# Patient Record
Sex: Female | Born: 1985 | Race: White | Hispanic: No | Marital: Married | Smoking: Former smoker
Health system: Southern US, Community
[De-identification: ages and names within clinical notes are randomized; demographics above are authoritative.]

## PROBLEM LIST (undated history)

## (undated) ENCOUNTER — Inpatient Hospital Stay (HOSPITAL_COMMUNITY): Payer: Self-pay

## (undated) DIAGNOSIS — D069 Carcinoma in situ of cervix, unspecified: Secondary | ICD-10-CM

## (undated) DIAGNOSIS — Z973 Presence of spectacles and contact lenses: Secondary | ICD-10-CM

## (undated) DIAGNOSIS — F329 Major depressive disorder, single episode, unspecified: Secondary | ICD-10-CM

## (undated) DIAGNOSIS — F32A Depression, unspecified: Secondary | ICD-10-CM

## (undated) HISTORY — DX: Depression, unspecified: F32.A

## (undated) HISTORY — DX: Major depressive disorder, single episode, unspecified: F32.9

---
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
## 2004-07-20 ENCOUNTER — Other Ambulatory Visit: Admission: RE | Admit: 2004-07-20 | Discharge: 2004-07-20 | Payer: Self-pay | Admitting: Obstetrics and Gynecology

## 2005-04-04 ENCOUNTER — Emergency Department (HOSPITAL_COMMUNITY): Admission: EM | Admit: 2005-04-04 | Discharge: 2005-04-04 | Payer: Self-pay | Admitting: Emergency Medicine

## 2005-06-23 ENCOUNTER — Other Ambulatory Visit: Admission: RE | Admit: 2005-06-23 | Discharge: 2005-06-23 | Payer: Self-pay | Admitting: Obstetrics and Gynecology

## 2005-09-09 ENCOUNTER — Other Ambulatory Visit: Admission: RE | Admit: 2005-09-09 | Discharge: 2005-09-09 | Payer: Self-pay | Admitting: Obstetrics and Gynecology

## 2006-10-05 ENCOUNTER — Emergency Department (HOSPITAL_COMMUNITY): Admission: EM | Admit: 2006-10-05 | Discharge: 2006-10-05 | Payer: Self-pay | Admitting: Emergency Medicine

## 2006-11-11 ENCOUNTER — Emergency Department (HOSPITAL_COMMUNITY): Admission: EM | Admit: 2006-11-11 | Discharge: 2006-11-11 | Payer: Self-pay | Admitting: Emergency Medicine

## 2006-12-16 ENCOUNTER — Emergency Department (HOSPITAL_COMMUNITY): Admission: EM | Admit: 2006-12-16 | Discharge: 2006-12-16 | Payer: Self-pay | Admitting: Emergency Medicine

## 2007-01-02 ENCOUNTER — Inpatient Hospital Stay (HOSPITAL_COMMUNITY): Admission: AD | Admit: 2007-01-02 | Discharge: 2007-01-02 | Payer: Self-pay | Admitting: Obstetrics

## 2007-08-21 ENCOUNTER — Inpatient Hospital Stay (HOSPITAL_COMMUNITY): Admission: AD | Admit: 2007-08-21 | Discharge: 2007-08-24 | Payer: Self-pay | Admitting: Obstetrics and Gynecology

## 2009-02-13 ENCOUNTER — Inpatient Hospital Stay (HOSPITAL_COMMUNITY): Admission: RE | Admit: 2009-02-13 | Discharge: 2009-02-16 | Payer: Self-pay | Admitting: Obstetrics and Gynecology

## 2009-02-13 ENCOUNTER — Encounter (INDEPENDENT_AMBULATORY_CARE_PROVIDER_SITE_OTHER): Payer: Self-pay | Admitting: Obstetrics and Gynecology

## 2009-06-18 ENCOUNTER — Emergency Department (HOSPITAL_COMMUNITY): Admission: EM | Admit: 2009-06-18 | Discharge: 2009-06-18 | Payer: Self-pay | Admitting: Emergency Medicine

## 2009-12-06 ENCOUNTER — Ambulatory Visit (HOSPITAL_COMMUNITY): Admission: RE | Admit: 2009-12-06 | Discharge: 2009-12-06 | Payer: Self-pay | Admitting: Emergency Medicine

## 2010-07-25 ENCOUNTER — Emergency Department (HOSPITAL_COMMUNITY): Admission: EM | Admit: 2010-07-25 | Discharge: 2010-07-25 | Payer: Self-pay | Admitting: Emergency Medicine

## 2011-01-12 ENCOUNTER — Encounter (HOSPITAL_COMMUNITY): Payer: Self-pay | Admitting: Radiology

## 2011-01-12 ENCOUNTER — Inpatient Hospital Stay (HOSPITAL_COMMUNITY): Payer: Self-pay

## 2011-01-12 ENCOUNTER — Inpatient Hospital Stay (HOSPITAL_COMMUNITY)
Admission: AD | Admit: 2011-01-12 | Discharge: 2011-01-12 | Disposition: A | Discharge status: Home / Self Care | Payer: Self-pay | Source: Ambulatory Visit | Attending: Obstetrics and Gynecology | Admitting: Obstetrics and Gynecology

## 2011-01-12 DIAGNOSIS — M545 Low back pain, unspecified: Secondary | ICD-10-CM | POA: Insufficient documentation

## 2011-01-12 DIAGNOSIS — O99891 Other specified diseases and conditions complicating pregnancy: Secondary | ICD-10-CM | POA: Insufficient documentation

## 2011-01-12 DIAGNOSIS — O9989 Other specified diseases and conditions complicating pregnancy, childbirth and the puerperium: Secondary | ICD-10-CM

## 2011-01-12 LAB — RPR: RPR Ser Ql: NONREACTIVE

## 2011-01-12 LAB — HEPATITIS B SURFACE ANTIGEN: Hepatitis B Surface Ag: NEGATIVE

## 2011-01-12 LAB — CBC
HCT: 34.5 % — ABNORMAL LOW (ref 36.0–46.0)
Hemoglobin: 11.8 g/dL — ABNORMAL LOW (ref 12.0–15.0)
MCV: 91.3 fL (ref 78.0–100.0)
RBC: 3.78 MIL/uL — ABNORMAL LOW (ref 3.87–5.11)
RDW: 13.5 % (ref 11.5–15.5)
WBC: 12 10*3/uL — ABNORMAL HIGH (ref 4.0–10.5)

## 2011-01-12 LAB — URINALYSIS, ROUTINE W REFLEX MICROSCOPIC
Bilirubin Urine: NEGATIVE
Ketones, ur: NEGATIVE mg/dL
Nitrite: NEGATIVE
Urobilinogen, UA: 0.2 mg/dL (ref 0.0–1.0)
pH: 5.5 (ref 5.0–8.0)

## 2011-01-12 LAB — DIFFERENTIAL
Basophils Absolute: 0 10*3/uL (ref 0.0–0.1)
Lymphocytes Relative: 21 % (ref 12–46)
Lymphs Abs: 2.5 10*3/uL (ref 0.7–4.0)
Neutro Abs: 8.5 10*3/uL — ABNORMAL HIGH (ref 1.7–7.7)
Neutrophils Relative %: 71 % (ref 43–77)

## 2011-01-12 LAB — WET PREP, GENITAL: Clue Cells Wet Prep HPF POC: NONE SEEN

## 2011-01-12 LAB — TYPE AND SCREEN: Antibody Screen: NEGATIVE

## 2011-01-12 LAB — RUBELLA SCREEN: Rubella: 150.4 IU/mL — ABNORMAL HIGH

## 2011-01-13 LAB — GC/CHLAMYDIA PROBE AMP, GENITAL
Chlamydia, DNA Probe: NEGATIVE
GC Probe Amp, Genital: NEGATIVE

## 2011-02-10 ENCOUNTER — Inpatient Hospital Stay (HOSPITAL_COMMUNITY): Payer: Self-pay

## 2011-02-10 ENCOUNTER — Inpatient Hospital Stay (HOSPITAL_COMMUNITY)
Admission: AD | Admit: 2011-02-10 | Discharge: 2011-02-10 | Disposition: A | Discharge status: Home / Self Care | Payer: Self-pay | Source: Ambulatory Visit | Attending: Obstetrics & Gynecology | Admitting: Obstetrics & Gynecology

## 2011-02-10 DIAGNOSIS — O99891 Other specified diseases and conditions complicating pregnancy: Secondary | ICD-10-CM | POA: Insufficient documentation

## 2011-02-10 DIAGNOSIS — R109 Unspecified abdominal pain: Secondary | ICD-10-CM

## 2011-02-10 DIAGNOSIS — W010XXA Fall on same level from slipping, tripping and stumbling without subsequent striking against object, initial encounter: Secondary | ICD-10-CM | POA: Insufficient documentation

## 2011-02-10 DIAGNOSIS — O9989 Other specified diseases and conditions complicating pregnancy, childbirth and the puerperium: Secondary | ICD-10-CM

## 2011-02-10 LAB — COMPREHENSIVE METABOLIC PANEL
AST: 22 U/L (ref 0–37)
CO2: 21 mEq/L (ref 19–32)
Calcium: 8.1 mg/dL — ABNORMAL LOW (ref 8.4–10.5)
Creatinine, Ser: 0.47 mg/dL (ref 0.4–1.2)
GFR calc Af Amer: >60 mL/min (ref >60)
GFR calc non Af Amer: >60 mL/min (ref >60)
Glucose, Bld: 95 mg/dL (ref 70–99)
Total Protein: 5.8 g/dL — ABNORMAL LOW (ref 6.0–8.3)

## 2011-02-10 LAB — CBC
Hemoglobin: 11.5 g/dL — ABNORMAL LOW (ref 12.0–15.0)
MCH: 30.6 pg (ref 26.0–34.0)
MCHC: 33.2 g/dL (ref 30.0–36.0)
RDW: 13.9 % (ref 11.5–15.5)

## 2011-02-10 LAB — ABO/RH: ABO/RH(D): O POS

## 2011-02-10 LAB — KLEIHAUER-BETKE STAIN: Quantitation Fetal Hemoglobin: 0 mL

## 2011-03-13 LAB — PREGNANCY, URINE: Preg Test, Ur: NEGATIVE

## 2011-03-13 LAB — DIFFERENTIAL
Basophils Absolute: 0 10*3/uL (ref 0.0–0.1)
Basophils Relative: 0 % (ref 0–1)
Neutro Abs: 10.5 10*3/uL — ABNORMAL HIGH (ref 1.7–7.7)
Neutrophils Relative %: 83 % — ABNORMAL HIGH (ref 43–77)

## 2011-03-13 LAB — URINALYSIS, ROUTINE W REFLEX MICROSCOPIC
Ketones, ur: >80 mg/dL — AB
Protein, ur: NEGATIVE mg/dL
Urobilinogen, UA: 0.2 mg/dL (ref 0.0–1.0)

## 2011-03-13 LAB — BASIC METABOLIC PANEL
Calcium: 9.6 mg/dL (ref 8.4–10.5)
Creatinine, Ser: 0.67 mg/dL (ref 0.4–1.2)
GFR calc Af Amer: >60 mL/min (ref >60)
Sodium: 139 mEq/L (ref 135–145)

## 2011-03-13 LAB — URINE MICROSCOPIC-ADD ON

## 2011-03-13 LAB — CBC
RBC: 4.57 MIL/uL (ref 3.87–5.11)
WBC: 12.5 10*3/uL — ABNORMAL HIGH (ref 4.0–10.5)

## 2011-03-13 LAB — RAPID URINE DRUG SCREEN, HOSP PERFORMED
Benzodiazepines: POSITIVE — AB
Cocaine: NOT DETECTED
Opiates: NOT DETECTED
Tetrahydrocannabinol: POSITIVE — AB

## 2011-03-17 LAB — CBC
Hemoglobin: 9 g/dL — ABNORMAL LOW (ref 12.0–15.0)
MCHC: 32.9 g/dL (ref 30.0–36.0)
MCV: 88.4 fL (ref 78.0–100.0)
Platelets: 230 10*3/uL (ref 150–400)
RBC: 3.07 MIL/uL — ABNORMAL LOW (ref 3.87–5.11)
RDW: 13.7 % (ref 11.5–15.5)
RDW: 14.1 % (ref 11.5–15.5)
WBC: 12.1 10*3/uL — ABNORMAL HIGH (ref 4.0–10.5)

## 2011-03-17 LAB — CCBB MATERNAL DONOR DRAW

## 2011-03-17 LAB — RPR: RPR Ser Ql: NONREACTIVE

## 2011-04-13 ENCOUNTER — Other Ambulatory Visit: Payer: Self-pay | Admitting: Obstetrics and Gynecology

## 2011-04-13 ENCOUNTER — Other Ambulatory Visit: Payer: Self-pay | Admitting: Obstetrics & Gynecology

## 2011-04-13 DIAGNOSIS — O0930 Supervision of pregnancy with insufficient antenatal care, unspecified trimester: Secondary | ICD-10-CM

## 2011-04-13 DIAGNOSIS — Z331 Pregnant state, incidental: Secondary | ICD-10-CM

## 2011-04-13 LAB — POCT URINALYSIS DIP (DEVICE)
Glucose, UA: NEGATIVE mg/dL
Nitrite: NEGATIVE
Urobilinogen, UA: 0.2 mg/dL (ref 0.0–1.0)

## 2011-04-18 ENCOUNTER — Ambulatory Visit (HOSPITAL_COMMUNITY)
Admission: RE | Admit: 2011-04-18 | Discharge: 2011-04-18 | Disposition: A | Discharge status: Home / Self Care | Payer: Medicaid Other | Source: Ambulatory Visit | Attending: Obstetrics and Gynecology | Admitting: Obstetrics and Gynecology

## 2011-04-18 DIAGNOSIS — O0930 Supervision of pregnancy with insufficient antenatal care, unspecified trimester: Secondary | ICD-10-CM | POA: Insufficient documentation

## 2011-04-18 DIAGNOSIS — O365990 Maternal care for other known or suspected poor fetal growth, unspecified trimester, not applicable or unspecified: Secondary | ICD-10-CM | POA: Insufficient documentation

## 2011-04-19 NOTE — Op Note (Signed)
Jasmine Hernandez, Jasmine Hernandez NO.:  [id number]   MEDICAL RECORD NO.:  [id number]          PATIENT TYPE:  INP   LOCATION:  9146                          [facility]   PHYSICIAN:  Osborn Coho, M.D.   DATE OF BIRTH:  Apr 14, 1986   DATE OF PROCEDURE:  02/13/2009  DATE OF DISCHARGE:                               OPERATIVE REPORT   PREOPERATIVE DIAGNOSES:  1. Postdates induction.  2. Failure to progress.  3. Decelerations.   POSTOPERATIVE DIAGNOSES:  1. Postdates induction.  2. Failure to progress.  3. Decelerations.   PROCEDURE:  Primary low transverse C-section.   ANESTHESIA:  Epidural.   ATTENDING DOCTOR:  Osborn Coho, MD   ASSISTANT:  Renaldo Reel. Emilee Hero, CNM   FLUIDS:  2000 mL.   ESTIMATED BLOOD LOSS:  900 mL.   URINE OUTPUT:  150 mL.   COMPLICATIONS:  None.   FINDINGS:  Live female infant with Apgars of 9 at 1 minute and 9 at 5  minutes weighing 8 pounds and 8 ounces.  Normal-appearing bilateral  ovaries and fallopian tubes.   PROCEDURE IN DETAIL:  The patient was taken to the operating room after  the risks, benefits, and alternatives have been discussed with the  patient.  The patient verbalized understanding and consent signed and  witnessed.  The patient was given a surgical level via the epidural and  prepped and draped in normal sterile fashion in the supine position.  A  Pfannenstiel skin incision was made and carried down to the underlying  layer of fascia with the scalpel and Bovie.  The fascia was excised  bilaterally in the midline and extended bilaterally with a Mayo  scissors.  Kocher clamps were placed on the inferior aspect of the  fascial incision and the rectus muscle excised from the fascia.  The  same was done on the superior aspect of the fascial incision.  The  rectus muscle was separated in the midline and the peritoneum entered  bluntly and extended manually.  The bladder blade was placed and bladder  flap was created with a  Metzenbaum scissors.  The uterine incision was  made with a scalpel and extended bilaterally with the bandage scissors.  The infant was delivered in the right occiput transverse vertex  presentation and the infant was delivered without difficulty.  The cord  was clamped and cut and the infant handed to the awaiting pediatricians.  The placenta was removed via fundal massage and the uterus cleared of  all clots and debris.  There was a small approximately 1 cm extension on  the right aspect of the uterine incision, which was repaired with 0  Vicryl via a running interlocking stitch.  The primary uterine incision  was repaired with 0 Vicryl via a running interlocking stitch.  The  extension and the primary incision were repaired with a second layer via  a imbricating stitch.  The intra-abdominal cavity was copiously  irrigated and normal-appearing bilateral ovaries and fallopian tubes  were noted.  The peritoneum was repaired with 2-0 chromic in a running  fashion.  The fascia was repaired with 0 Vicryl in a running fashion and  as of note, the fascia was noted to be extremely thin, especially on the  left aspect of the fascial incision.  The subcutaneous tissue was  irrigated and made hemostatic with the Bovie and reapproximated using  several interrupted stitches of 2-0 plain.  The skin was reapproximated  using 3-0 Monocryl via subcuticular stitch.  Sponge, lap, and needle  count was correct.  The patient tolerated the procedure well and is  currently awaiting transfer to the recovery room in good condition.       Osborn Coho, M.D.  Electronically Signed     AR/MEDQ  D:  02/13/2009  T:  02/14/2009  Job:  [id number]

## 2011-04-19 NOTE — H&P (Signed)
NAMEJASHA, HODZIC NO.:  [id number]   MEDICAL RECORD NO.:  [id number]          PATIENT TYPE:  INP   LOCATION:  9170                          [facility]   PHYSICIAN:  Osborn Coho, M.D.   DATE OF BIRTH:  Nov 02, 1986   DATE OF ADMISSION:  08/21/2007  DATE OF DISCHARGE:                              HISTORY & PHYSICAL   This 25 year old gravida 1, para 0 at 15 and 2/7 weeks who presents for  induction of labor secondary to post dates.  She denies leaking or  bleeding and reports positive fetal movement.  Pregnancy has been  followed by the nurse midwife service and remarkable for:  1. Irregular menses.  2. Anxiety.  3. Irritable bowel syndrome.  4. Family history of cystic fibrosis.   ALLERGIES:  None.   OBSTETRICAL HISTORY:  Remarkable for being a primigravida.   MEDICAL HISTORY:  Remarkable for history of STZH 15, childhood  varicella, history of irritable bowel syndrome and history of depression  and anxiety.   SURGICAL HISTORY:  Negative.   FAMILY HISTORY:  Remarkable for grandfather with heart disease,  grandmother with hypertension, grandfather with emphysema and stroke and  lung cancer.  Father with drug use, alcohol use.  Genetic history is  remarkable for a sister with cystic fibrosis, a third cousin with spina  bifida, a nephew of the baby's father with sickle cell, and a sister  with twins.   SOCIAL HISTORY:  The patient is a married to Michaela Corner who is  involved and supportive.  She works as an Public house manager.  She does not report a  religious affiliation.  She denies any alcohol, tobacco or drug use.   PERTINENT LABS:  Hemoglobin 12.8, platelets 235, blood type O+, antibody  screen negative, RPR nonreactive, rubella immune, hepatitis negative,  HIV negative, gonorrhea negative, chlamydia negative, cystic fibrosis  negative.   HISTORY OF CURRENT PREGNANCY:  Patient entered care at [redacted] weeks  gestation.  She declined first trimester  screening and quad screening.  She had an ultrasound at 18 weeks that was normal.  She stopped smoking  in the second trimester.  She had an elevated glucose at 26 weeks and a  3 hour GTT that was normal.  She had insomnia in the third trimester  treated with Ambien and positive group B Strep at term and presents  today for induction of labor secondary to post dates.   OBJECTIVE DATA:  VITAL SIGNS:  Stable.  Afebrile.  HEENT:  Within normal limits.  Thyroid normal, not enlarged.  CHEST:  Clear to auscultation.  HEART:  Regular rate and rhythm.  ABDOMEN:  Gravid at 39 cm vertex __________ .  EFM shows reactive fetal  heart rate with irregular contractions.  Cervix is closed, 60%, -3 and  soft with a vertex presentation.  EXTREMITIES:  Within normal limits.   ASSESSMENT:  1. Intrauterine pregnancy at 41-2/7 weeks.  2. Post dates.   PLAN:  1. Admit to birthing suite per Dr. Su Hilt.  2. Routine CNM orders.  3. Potassium per low dose protocol.  4. Penicillin prophylaxis.      Marie L. Williams, C.N.M.      Osborn Coho, M.D.  Electronically Signed    MLW/MEDQ  D:  08/21/2007  T:  08/21/2007  Job:  [id number]

## 2011-04-19 NOTE — Consult Note (Signed)
NAMEKHANIYA, TENAGLIA                  ACCOUNT NO.:  [id number]   MEDICAL RECORD NO.:  [id number]         PATIENT TYPE:  INP   LOCATION:                                [facility]   PHYSICIAN:  Hal Morales, M.D.DATE OF BIRTH:  1986/06/20   DATE OF CONSULTATION:  DATE OF DISCHARGE:  02/16/2009                                 CONSULTATION   HISTORY OF PRESENT ILLNESS:  Jasmine Hernandez is a 25 year old gravida 2, para 1-  0-0-1, at 41-2/7 weeks who presents for induction of labor secondary to  post dates.  Cervix has been 3 cm in the office.  Pregnancy has been  remarkable for:  1.  History of moderate shoulder dystocia with her last  pregnancy with a weight 8 pounds 14 ounces; 2.  Questionable LMP, no  regular cycles; 3.  Conception on OCTs; 4.  History of anxiety and  depression; 5.  History of IBS; 6.  Group B Strep negative.   LABORATORY DATA:  Prenatal labs:  Blood type is O positive, Rh antibody  negative, VDRL nonreactive, rubella titer positive.  Hepatitis B surface  antigen negative.  HIV was nonreactive.  Cystic fibrosis testing was  negative.  Two sickle A cultures were negative at her first visit.  Pap  was normal.  Hemoglobin upon entering practice was 11.8.  It was within  normal limits at 28 weeks.  The patient declined first trimester screen  and AFP.  Glucola was normal.  Group B Strep culture and other cultures  were negative at 36 weeks.   HISTORY OF PRESENT PREGNANCY:  The patient entered care at approximately  10-12 weeks.  She had some question of LMP secondary to conception on  birth control pills.  She had an ultrasound for dating at 1 weeks which  gave an [facility] ([facility] of February 04, 2009.  She declined first trimester screen and  AFP.  She was having some low blood pressure at 16 and 18 weeks with  comfort measures reviewed.  She received H1 N1 vaccine at 23 weeks.  She  had a normal Glucola.  She did have some severe insomnia at 28 weeks was  given Ambien.  She saw a  dentist at 34 weeks for tooth pain and was  treated with an antibiotic and Vicodin.  Her group B Strep culture and  GC and Chlamydia cultures were negative at 36 weeks.   OBSTETRICAL HISTORY:  In 2008 she had a vaginal birth of a female infant,  weight 8 pounds 14 ounces.  She delivered at 41-2/7 weeks.  She was in  labor 26 hours.  She had epidural anesthesia.  She was induced for that  labor as well, had meconium, had a midline episiotomy and moderate  shoulder dystocia.  She did have positive group B Strep with that  pregnancy as well.   MEDICAL HISTORY:  She was on birth control pills, patch in the past.  She did conceive on birth control pills.  She was treated for Chlamydia,  gonorrhea, HPV lesion, and yeast at age 72.  She reports  the usual  childhood illnesses.  She has a history of IBF but has had no medication  needs.  She has a history of UTI.  She has some seasonal depression.  She has been treated in the past for anxiety but has not required any  medication during this pregnancy.   ALLERGIES:  She has no known medication allergies.   FAMILY HISTORY:  Maternal grandfather had a pacemaker.  Paternal  grandmother has hypertension.  Maternal grandfather had emphysema.  Maternal grandfather had a stroke.  Maternal grandfather had lung cancer  and brain cancer.  Father's side of the family are alcoholic users, drug  users, and smokers.  The patient's only other hospitalization was for  childbirth.   PAST SURGICAL HISTORY:  She denies any surgical history.   GENETIC HISTORY:  The patient's half sister has cystic fibrosis.  The  patient is a negative cystic fibrosis carrier.  The patient's third  cousin has spina bifida.  The father of the baby's nephew has sickle  cell trait and the patient has sisters who are twins.   SOCIAL HISTORY:  The patient is Native Naval architect.  She denies a religious  affiliation.  She is single.  Her partner's name is Arnette Norris.  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  Her partner is not  employed.  She has been followed by the certified nurse midwife service  of [place].  She denies any alcohol, drug, or tobacco use  during this pregnancy.   PHYSICAL EXAMINATION:  VITAL SIGNS:  Stable.  The patient is afebrile.  HEENT:  Within normal limits.  LUNGS:  Her breath sounds are clear.  HEART:  Regular rate and rhythm without murmur.  BREASTS:  Soft and nontender.  ABDOMEN:  Fundal height is approximately 40 cm.  Estimated fetal weight  on ultrasound today was 8-1/2 to 8 pounds 13 ounces.  Fetal heart rate  was in the 140s in the office today.  CERVIX:  Cervical exam today was 3 cm, vertex presentation by office  exam.  Uterine contractions were reported by the patient as occasional.  EXTREMITIES:  Deep tendon reflexes were 2+ without clonus.  There is  trace edema noted.   IMPRESSION:  1. Intrauterine pregnancy at 41-2/7 weeks.  2. Post dates.  3. History of shoulder dystocia.   PLAN:  1. Admit to birthing suite for consult with Dr. Pennie Rushing as attending      physician.  2. Routine certified nurse midwife orders.  3. Anticipate starting Pitocin to facilitate uterine contractions and      artifical rupture of membranes as an adjunct.  4. The patient's previous history of shoulder dystocia was discussed      with her including the plan for avoidance of an operative delivery,      need to monitor descent in the second stage, and the risks of      shoulder dystocia recurring.  The patient declined a cesarean      section for her history of previous shoulder dystocia and does wish      to proceed with induction.      Renaldo Reel Emilee Hero, C.N.M.      Hal Morales, M.D.  Electronically Signed    VLL/MEDQ  D:  02/12/2009  T:  02/13/2009  Job:  [id number]

## 2011-04-22 NOTE — Discharge Summary (Signed)
NAMEKIMBERLEA, SCHLAG                  ACCOUNT NO.:  [id number]   MEDICAL RECORD NO.:  [id number]          PATIENT TYPE:  INP   LOCATION:  9146                          [facility]   PHYSICIAN:  Janine Limbo, M.D.DATE OF BIRTH:  22-Jun-1986   DATE OF ADMISSION:  02/13/2009  DATE OF DISCHARGE:  02/16/2009                               DISCHARGE SUMMARY   ADMITTING DIAGNOSES:  1. Intrauterine pregnancy at 41-2/7th weeks.  2. Post dates.  3. History of shoulder dystocia.   DISCHARGE DIAGNOSES:  1. Intrauterine pregnancy at 41-2/7th weeks.  2. Failure to progress.  3. Nonreassuring fetal heart rate.   PROCEDURES:  Primary low transverse cesarean section.   HOSPITAL COURSE:  Jasmine Hernandez is a 25 year old gravida 2, para 1-0-0-1 of  41-2/7th weeks who presented for induction on the morning of February 13, 2009, due to post dates.  Cervix had been 3 cm in the office.  Pregnancy  has been remarkable for;  1. History of moderate shoulder dystocia with a weight of 8 pounds 14      ounces in her previous delivery.  2. Questionable last menstrual period with irregular cycles.  3. Conception on oral contraceptives.  4. History of anxiety and depression.  5. History of irritable bowel syndrome.  6. Group B strep negative.   On admission, cervix was posterior 3 cm, 60% vertex, -2 to -3.  The  risks and benefits of induction were reviewed with the patient including  failure of method, prolonged labor, shoulder dystocia, and need for  cesarean section.  The patient and her husband seem to understand these  risks and wished to proceed.  Pitocin was begun in light of the station  of the fetal vertex.  Artificial rupture of membranes was accomplished  at approximately 12:25 p.m. with cervix 4-5 at that time, 80% vertex at  -2.  She began to have some variable decelerations with some scattered  lates after rupture of membranes.  IUPC was inserted.  Pitocin had been  stopped in the course of the  decelerations and with recovery of fetal  heart rate, it was restarted.  She had a prolonged bradycardia episode  at 4:40 p.m., had gradually resolved.  She was consented for C-section  at that time; however, the patient wished to wait period of time for  making the final decision.  She was reevaluated again soon after that.  Amount of these were adequate for approximately 2 hours.  She was  offered a C-section at that time in light of the cervix not changing.  The patient again declined.  An amnioinfusion was begun.  By  approximately 7:30 that evening based on early and variable  decelerations resumed and then variables became deeper over time after  the Pitocin have been restarted to keep her cervix adequate.  Her cervix  did not change and began to be swollen.  She was advised again that  recommendation was for C-section and the patient at that time was  agreeable.  She was taken to the operating room where a primary low  transverse  cesarean section was performed by Dr. Su Hilt under existing  epidural anesthesia.  Findings were of viable female weight 8 pounds 8  ounces.  Apgars were 9 and 9.  Infant was taken to the full-term  nursery.  Mother was taken to recovery in good condition.   By postop day #1, the patient is doing well.  She was up ad lib.  She  was working on breast-feeding.  Her hemoglobin was 9.0 down from 10.5,  white blood cell count was 18.9, and platelet count was 199.  She was  begun on iron supplementation.  The rest of her hospital stay was  essentially uncomplicated.  Her incision remained clean, dry, and  intact.  She was up ad lib without any dizziness or syncope.  She began  to tolerate a regular diet and was having no issues.  By postop day #3,  she was desiring of Depo prior to discharge and then a plan for an IUD  later after her initial 12-week recovery.  She was still having good  pain management with Motrin and Percocet and her incision was clean,  dry,  and intact and she was up ad lib.  Her lochia was scant.  Her  fundus was firm.  She was doing breast and bottle feeding by that time.  She was deemed to receive full benefit of her hospital stay and was  discharged home in stable condition.   DISCHARGE INSTRUCTIONS:  Per [facility] handout.   DISCHARGE MEDICATIONS:  1. Motrin 600 mg p.o. q.6 h p.r.n. pain.  2. Percocet 5/325 one to two p.o. daily 4 hours p.r.n. pain.  3. Concept [facility] prenatal vitamin one p.o. daily.  4. She was also recommended take Colace one p.o. daily over-the-      counter.  5. She was given Depo-Provera 150 mg IM x1 just prior to discharge.   Discharge followup will occur in 6 weeks at [facility] or  p.r.n.      Renaldo Reel Emilee Hero, C.N.M.      Janine Limbo, M.D.  Electronically Signed    VLL/MEDQ  D:  03/19/2009  T:  03/19/2009  Job:  [id number]

## 2011-04-26 ENCOUNTER — Inpatient Hospital Stay (HOSPITAL_COMMUNITY)
Admission: AD | Admit: 2011-04-26 | Discharge: 2011-04-26 | Disposition: A | Discharge status: Home / Self Care | Payer: Medicaid Other | Source: Ambulatory Visit | Attending: Obstetrics & Gynecology | Admitting: Obstetrics & Gynecology

## 2011-04-26 DIAGNOSIS — R109 Unspecified abdominal pain: Secondary | ICD-10-CM

## 2011-04-26 DIAGNOSIS — M62838 Other muscle spasm: Secondary | ICD-10-CM

## 2011-04-26 DIAGNOSIS — O99891 Other specified diseases and conditions complicating pregnancy: Secondary | ICD-10-CM | POA: Insufficient documentation

## 2011-04-26 DIAGNOSIS — O9989 Other specified diseases and conditions complicating pregnancy, childbirth and the puerperium: Secondary | ICD-10-CM

## 2011-04-26 LAB — URINALYSIS, ROUTINE W REFLEX MICROSCOPIC
Glucose, UA: NEGATIVE mg/dL
Ketones, ur: NEGATIVE mg/dL
Nitrite: NEGATIVE
Protein, ur: NEGATIVE mg/dL

## 2011-04-26 LAB — WET PREP, GENITAL: Trich, Wet Prep: NONE SEEN

## 2011-04-26 LAB — URINE MICROSCOPIC-ADD ON

## 2011-04-27 LAB — GC/CHLAMYDIA PROBE AMP, GENITAL
Chlamydia, DNA Probe: NEGATIVE
GC Probe Amp, Genital: NEGATIVE

## 2011-05-04 ENCOUNTER — Other Ambulatory Visit: Payer: Self-pay | Admitting: Physician Assistant

## 2011-05-04 DIAGNOSIS — O2390 Unspecified genitourinary tract infection in pregnancy, unspecified trimester: Secondary | ICD-10-CM

## 2011-05-04 DIAGNOSIS — O34219 Maternal care for unspecified type scar from previous cesarean delivery: Secondary | ICD-10-CM

## 2011-05-04 DIAGNOSIS — O0930 Supervision of pregnancy with insufficient antenatal care, unspecified trimester: Secondary | ICD-10-CM

## 2011-05-05 LAB — POCT URINALYSIS DIP (DEVICE)
Glucose, UA: NEGATIVE mg/dL
Ketones, ur: >=160 mg/dL — AB
Specific Gravity, Urine: >=1.03 (ref 1.005–1.030)

## 2011-05-11 ENCOUNTER — Inpatient Hospital Stay (HOSPITAL_COMMUNITY)
Admission: AD | Admit: 2011-05-11 | Discharge: 2011-05-13 | DRG: 775 | Disposition: A | Discharge status: Home / Self Care | Payer: Medicaid Other | Source: Ambulatory Visit | Attending: Obstetrics & Gynecology | Admitting: Obstetrics & Gynecology

## 2011-05-11 DIAGNOSIS — O99892 Other specified diseases and conditions complicating childbirth: Secondary | ICD-10-CM | POA: Diagnosis present

## 2011-05-11 DIAGNOSIS — O9989 Other specified diseases and conditions complicating pregnancy, childbirth and the puerperium: Secondary | ICD-10-CM

## 2011-05-11 DIAGNOSIS — Z22330 Carrier of Group B streptococcus: Secondary | ICD-10-CM

## 2011-05-11 LAB — CBC
HCT: 34.4 % — ABNORMAL LOW (ref 36.0–46.0)
Hemoglobin: 11.6 g/dL — ABNORMAL LOW (ref 12.0–15.0)
MCV: 92.2 fL (ref 78.0–100.0)
RBC: 3.73 MIL/uL — ABNORMAL LOW (ref 3.87–5.11)
RDW: 14.5 % (ref 11.5–15.5)
WBC: 17.6 10*3/uL — ABNORMAL HIGH (ref 4.0–10.5)

## 2011-05-11 LAB — RPR: RPR Ser Ql: NONREACTIVE

## 2011-05-11 LAB — RAPID URINE DRUG SCREEN, HOSP PERFORMED
Benzodiazepines: NOT DETECTED
Cocaine: NOT DETECTED
Opiates: POSITIVE — AB
Tetrahydrocannabinol: POSITIVE — AB

## 2011-05-16 ENCOUNTER — Inpatient Hospital Stay (HOSPITAL_COMMUNITY)
Admission: AD | Admit: 2011-05-16 | Discharge: 2011-05-16 | Disposition: A | Discharge status: Home / Self Care | Payer: Medicaid Other | Source: Ambulatory Visit | Attending: Obstetrics & Gynecology | Admitting: Obstetrics & Gynecology

## 2011-05-16 DIAGNOSIS — O901 Disruption of perineal obstetric wound: Secondary | ICD-10-CM | POA: Insufficient documentation

## 2011-05-23 ENCOUNTER — Ambulatory Visit (INDEPENDENT_AMBULATORY_CARE_PROVIDER_SITE_OTHER): Payer: Medicaid Other | Admitting: Family Medicine

## 2011-05-23 DIAGNOSIS — F329 Major depressive disorder, single episode, unspecified: Secondary | ICD-10-CM

## 2011-05-23 DIAGNOSIS — N719 Inflammatory disease of uterus, unspecified: Secondary | ICD-10-CM

## 2011-05-24 NOTE — Group Therapy Note (Unsigned)
NAMEJAMAIRA, Jasmine Hernandez NO.:  [id number]  MEDICAL RECORD NO.:  [id number]           PATIENT TYPE:  A  [facility]ATION:  [facility]                   [facility]  PHYSICIAN:  Lucina Mellow, DO   DATE OF BIRTH:  09-27-1986  DATE OF SERVICE:  05/23/2011                                 CLINIC NOTE  HISTORY OF PRESENT ILLNESS:  Ms. Jasmine Hernandez is a 25 year old gravida 3, para 3- 2-1-0-3 who presents to clinic today for pain at stitch site.  The patient was in MAU on Monday for similar pain where they told her to sitz bath and continue Percocet and ibuprofen.  The patient states she has tried this modalities.  She is still a 7/10 pain, which is relieved to 4/10 with Percocet and ibuprofen.  The patient is also using lidocaine jelly for the pain, which also helps.  The patient also complaints of cramping and bleeding worse than after delivery.  She denies fevers or chills.  She denies dysuria.  She denies constipation. The patient does state that it is more painful to defecate.  The patient also states that she has been feeling depressed because her husband recently left her and would like contact numbers for psychologist.  She states she does not want to be medication at this time.  She denies any suicidal ideation or homicidal ideation.  PAST MEDICAL HISTORY:  Depression.  ALLERGIES:  No known drug allergies.  HOME MEDICATIONS: 1. Percocet p.o. p.r.n. pain. 2. Ibuprofen 800 mg p.o. p.r.n. pain. 3. Lidocaine Jelly 2% topical p.r.n. pain. 4. Colace oral p.o. p.r.n. constipation.  REVIEW OF SYSTEMS:  GENERAL:  The patient is in no acute distress who appears her stated age of 18.  Denies fevers.  GI:  Denies constipation, nausea, or vomiting.  Does take pain with defecation.  GU:  The patient denies dysuria.  PHYSICAL EXAMINATION:  ABDOMEN:  Soft, mildly tender with palpation. VAGINAL:  Laceration appears to be healing well.  Sutures are visible, not reabsorbed. SKIN:   Two hemorrhoids externally visible on anal surface, one at 11 O'clock and one at 3 O'clock.  ASSESSMENT/PLAN:  This is a 25 year old gravida 3, para 3-2-1-0-3, 3 weeks postpartum who complaints of pain related with her sutures. 1. Failure of suture absorption - suture was visible on exam.  Suture     was removed.  Refill was given for lidocaine jelly who present to     be apply topically to affected area q.4 h. p.r.n. pain.  Refill was     also given for Percocet one tablet p.o. q.6 h. p.r.n. severe pain. 2. Endometritis - the patient's states abdominal cramping and     bleeding.  The patient has been afebrile, but this could be due to     her continuous use of ibuprofen.  The patient was given a     prescription for Augmentin 875 mg one tablet p.o. b.i.d. for 10     days. 3. Postpartum depression - this could be a situational depression due     to her recent change a relationship with her husband.  The patient  requests phone number for Psychology.  She refuses SSRIs at the     current time. 4. 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 5. The patient advised to follow up in clinic next week as for her 4-     week followup and Mirena placement.          ______________________________ Lucina Mellow, DO    SH/MEDQ  D:  05/23/2011  T:  05/24/2011  Job:  [id number]

## 2011-06-02 ENCOUNTER — Ambulatory Visit (INDEPENDENT_AMBULATORY_CARE_PROVIDER_SITE_OTHER): Payer: Medicaid Other | Admitting: Physician Assistant

## 2011-06-02 DIAGNOSIS — F329 Major depressive disorder, single episode, unspecified: Secondary | ICD-10-CM

## 2011-06-03 NOTE — Group Therapy Note (Signed)
NAME:  Jasmine Hernandez, Jasmine Hernandez NO.:  [id number]  MEDICAL RECORD NO.:  [id number]           PATIENT TYPE:  A  [facility]                   [facility]  PHYSICIAN:  Maylon Cos, CNM    DATE OF BIRTH:  07-15-1986  DATE OF SERVICE:  06/02/2011                                 CLINIC NOTE  The patient presents today to discuss depression medication.  She is approximately 3 weeks postpartum.  She states that she has had some difficulty with depression in the past and has been on Celexa.  This seemed to work for her.  She states her recent home situation has changed and she has been having feelings of depression including crying episodes.  She denies homicidal or suicidal ideations.  Actually, she just think she needs something to help.  She denies any other problems at this time.  Other than a toothache for which she is trying to see a dentist.  PHYSICAL EXAMINATION:  GENERAL:  She is a pleasant, alert and oriented x3, in no apparent distress. VITAL SIGNS:  Stable.  ASSESSMENT AND PLAN: 1. Depression.  We discussed this at length.  We will start her on     Celexa 20 mg one p.o. daily.  She has a followup appointment     already scheduled for which she will keep for her 4-week postpartum     visit and IUD insertion.  I did discuss diet, activities, risks,     and precautions, she understood this and agreed. 2. Toothache.  She is getting scheduled to see a dentist in the     meantime and did give her Lortab 5/500 to take one p.o. q.4-6 h.     p.r.n. pain #20, with no refills.  She is to use this sparingly.     She had no other questions or problems at this time.    ______________________________ Clinton Gallant. Rice III   ______________________________ Maylon Cos, CNM   /MEDQ  D:  06/02/2011  T:  06/03/2011  Job:  [id number]

## 2011-06-04 DIAGNOSIS — F329 Major depressive disorder, single episode, unspecified: Secondary | ICD-10-CM

## 2011-06-04 DIAGNOSIS — F32A Depression, unspecified: Secondary | ICD-10-CM | POA: Insufficient documentation

## 2011-06-15 ENCOUNTER — Encounter: Payer: Self-pay | Admitting: Family Medicine

## 2011-06-15 ENCOUNTER — Ambulatory Visit (INDEPENDENT_AMBULATORY_CARE_PROVIDER_SITE_OTHER): Payer: Medicaid Other | Admitting: Family Medicine

## 2011-06-15 ENCOUNTER — Other Ambulatory Visit: Payer: Self-pay | Admitting: Family Medicine

## 2011-06-15 VITALS — BP 132/68 | HR 102 | Temp 98.7°F | Ht 61.25 in | Wt 122.1 lb

## 2011-06-15 DIAGNOSIS — Z30430 Encounter for insertion of intrauterine contraceptive device: Secondary | ICD-10-CM

## 2011-06-15 DIAGNOSIS — Z3009 Encounter for other general counseling and advice on contraception: Secondary | ICD-10-CM

## 2011-06-15 DIAGNOSIS — K029 Dental caries, unspecified: Secondary | ICD-10-CM

## 2011-06-15 MED ORDER — LEVONORGESTREL 20 MCG/24HR IU IUD: 1.0000 | INTRAUTERINE_SYSTEM | Freq: Once | INTRAUTERINE | Status: DC

## 2011-06-15 MED ORDER — OXYCODONE-ACETAMINOPHEN 5-325 MG PO TABS: 1.0000 | ORAL_TABLET | Freq: Four times a day (QID) | ORAL | Status: DC | PRN

## 2011-06-15 NOTE — Progress Notes (Signed)
  IUD Insertion Procedure Note  Pre-operative Diagnosis: Undesired fertility  Post-operative Diagnosis: same  Indications: contraception  Procedure Details  Urine pregnancy test was done today and result was negative.  The risks (including infection, bleeding, pain, and uterine perforation) and benefits of the procedure were explained to the patient and Written informed consent was obtained.    Cervix cleansed with Betadine. Uterus sounded to 7 cm. IUD inserted without difficulty. String visible and trimmed. Patient tolerated procedure well.  IUD Information: Mirena.  Condition: Stable  Complications: None  Plan:  The patient was advised to call for any fever or for prolonged or severe pain or bleeding. She was advised to use OTC motrin as needed for mild to moderate pain.   ** Pt also asked about PPD ssx and recent taking of celexa. Pt states that she really can feel a difference with the medication on board. She continues to have significant tooth pain from her decaying tooth on the RT lower jaw. Pt has to wait for Medicaid to accept her referral for dental and removal of her tooth. Pt would like refill on the percocet to help with her pain. She feels much better with taking care of her son, and is going to return to work this Friday. [facility]OB is not contacting her at all, she is not sexually active.  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 Decay of RT lower innermost tooth with discoloration of gum from filling.  A/P: Percocet for tooth pain. IUD placed as noted above. Pt to RTC for IUD string check in 4 weeks at which time we can again reassess her celexa/refill as needed.

## 2011-06-15 NOTE — Patient Instructions (Signed)
What is this medicine? LEVONORGESTREL IUD (LEE voe nor jes trel) is a contraceptive (birth control) device. It is used to prevent pregnancy and to treat heavy bleeding that occurs during your period. It can be used for up to 5 years. This medicine may be used for other purposes; ask your health care provider or pharmacist if you have questions.   What should I tell my health care provider before I take this medicine? They need to know if you have any of these conditions: -abnormal Pap smear -cancer of the breast, uterus, or cervix -diabetes -endometritis -genital or pelvic infection now or in the past -have more than one sexual partner or your partner has more than one partner -heart disease -history of an ectopic or tubal pregnancy -immune system problems -IUD in place -liver disease or tumor -problems with blood clots or take blood-thinners -use intravenous drugs -uterus of unusual shape -vaginal bleeding that has not been explained -an unusual or allergic reaction to levonorgestrel, other hormones, silicone, or polyethylene, medicines, foods, dyes, or preservatives -pregnant or trying to get pregnant -breast-feeding   How should I use this medicine? This device is placed inside the uterus by a health care professional. Talk to your pediatrician regarding the use of this medicine in children. Special care may be needed. Overdosage: If you think you have taken too much of this medicine contact a poison control center or emergency room at once. NOTE: This medicine is only for you. Do not share this medicine with others.   What if I miss a dose? This does not apply.   What may interact with this medicine? Do not take this medicine with any of the following medications: -amprenavir -bosentan -fosamprenavir   This medicine may also interact with the following medications: -aprepitant -barbiturate medicines for inducing sleep or treating  seizures -bexarotene -griseofulvin -medicines to treat seizures like carbamazepine, ethotoin, felbamate, oxcarbazepine, phenytoin, topiramate -modafinil -pioglitazone -rifabutin -rifampin -rifapentine -some medicines to treat HIV infection like atazanavir, indinavir, lopinavir, nelfinavir, tipranavir, ritonavir -St. John's wort -warfarin   This list may not describe all possible interactions. Give your health care provider a list of all the medicines, herbs, non-prescription drugs, or dietary supplements you use. Also tell them if you smoke, drink alcohol, or use illegal drugs. Some items may interact with your medicine.   What should I watch for while using this medicine? Visit your doctor or health care professional for regular check ups. See your doctor if you or your partner has sexual contact with others, becomes HIV positive, or gets a sexual transmitted disease.   This product does not protect you against HIV infection (AIDS) or other sexually transmitted diseases.   You can check the placement of the IUD yourself by reaching up to the top of your vagina with clean fingers to feel the threads. Do not pull on the threads. It is a good habit to check placement after each menstrual period. Call your doctor right away if you feel more of the IUD than just the threads or if you cannot feel the threads at all.   The IUD may come out by itself. You may become pregnant if the device comes out. If you notice that the IUD has come out use a backup birth control method like condoms and call your health care provider.   Using tampons will not change the position of the IUD and are okay to use during your period.   What side effects may I notice from receiving this  medicine? Side effects that you should report to your doctor or health care professional as soon as possible: -allergic reactions like skin rash, itching or hives, swelling of the face, lips, or tongue -fever, flu-like  symptoms -genital sores -high blood pressure -no menstrual period for 6 weeks during use -pain, swelling, warmth in the leg -pelvic pain or tenderness -severe or sudden headache -signs of pregnancy -stomach cramping -sudden shortness of breath -trouble with balance, talking, or walking -unusual vaginal bleeding, discharge -yellowing of the eyes or skin   Side effects that usually do not require medical attention (report to your doctor or health care professional if they continue or are bothersome): -acne -breast pain -change in sex drive or performance -changes in weight -cramping, dizziness, or faintness while the device is being inserted -headache -irregular menstrual bleeding within first 3 to 6 months of use -nausea   This list may not describe all possible side effects. Call your doctor for medical advice about side effects. You may report side effects to FDA at 1-800-FDA-1088.   Where should I keep my medicine? This does not apply.   NOTE:This sheet is a summary. It may not cover all possible information. If you have questions about this medicine, talk to your doctor, pharmacist, or health care provider.      2011, [employer].

## 2011-07-18 ENCOUNTER — Ambulatory Visit: Payer: Medicaid Other | Admitting: Family Medicine

## 2011-09-15 LAB — CBC
HCT: 30.6 — ABNORMAL LOW
HCT: 36.7
Hemoglobin: 10.7 — ABNORMAL LOW
MCHC: 35
MCHC: 35.1
MCV: 92.2
Platelets: 164
Platelets: 223
RDW: 14.9 — ABNORMAL HIGH
WBC: 8.5

## 2011-09-15 LAB — RPR: RPR Ser Ql: NONREACTIVE

## 2011-09-15 LAB — CCBB MATERNAL DONOR DRAW

## 2011-09-29 ENCOUNTER — Emergency Department (HOSPITAL_COMMUNITY): Payer: Self-pay

## 2011-09-29 ENCOUNTER — Emergency Department (HOSPITAL_COMMUNITY): Admission: EM | Admit: 2011-09-29 | Payer: Self-pay | Source: Home / Self Care

## 2011-09-29 ENCOUNTER — Emergency Department (HOSPITAL_COMMUNITY)
Admission: EM | Admit: 2011-09-29 | Discharge: 2011-09-29 | Disposition: A | Discharge status: Home / Self Care | Payer: Self-pay | Attending: Emergency Medicine | Admitting: Emergency Medicine

## 2011-09-29 DIAGNOSIS — F17200 Nicotine dependence, unspecified, uncomplicated: Secondary | ICD-10-CM | POA: Insufficient documentation

## 2011-09-29 DIAGNOSIS — T148XXA Other injury of unspecified body region, initial encounter: Secondary | ICD-10-CM | POA: Insufficient documentation

## 2011-09-29 DIAGNOSIS — M545 Low back pain, unspecified: Secondary | ICD-10-CM | POA: Insufficient documentation

## 2011-09-29 DIAGNOSIS — S20229A Contusion of unspecified back wall of thorax, initial encounter: Secondary | ICD-10-CM | POA: Insufficient documentation

## 2012-03-15 ENCOUNTER — Encounter (HOSPITAL_COMMUNITY): Payer: Self-pay | Admitting: *Deleted

## 2012-03-15 ENCOUNTER — Emergency Department (HOSPITAL_COMMUNITY)
Admission: EM | Admit: 2012-03-15 | Discharge: 2012-03-15 | Disposition: A | Discharge status: Home / Self Care | Payer: Self-pay | Attending: Emergency Medicine | Admitting: Emergency Medicine

## 2012-03-15 DIAGNOSIS — F17200 Nicotine dependence, unspecified, uncomplicated: Secondary | ICD-10-CM | POA: Insufficient documentation

## 2012-03-15 DIAGNOSIS — F329 Major depressive disorder, single episode, unspecified: Secondary | ICD-10-CM | POA: Insufficient documentation

## 2012-03-15 DIAGNOSIS — M545 Low back pain, unspecified: Secondary | ICD-10-CM | POA: Insufficient documentation

## 2012-03-15 DIAGNOSIS — F3289 Other specified depressive episodes: Secondary | ICD-10-CM | POA: Insufficient documentation

## 2012-03-15 MED ORDER — OXYCODONE-ACETAMINOPHEN 5-325 MG PO TABS: 1.0000 | ORAL_TABLET | ORAL | Status: AC | PRN

## 2012-03-15 MED ORDER — CYCLOBENZAPRINE HCL 10 MG PO TABS: 10.0000 mg | ORAL_TABLET | Freq: Two times a day (BID) | ORAL | Status: AC | PRN
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
## 2012-03-22 NOTE — ED Provider Notes (Signed)
Medical screening examination/treatment/procedure(s) were performed by non-physician practitioner and as supervising physician I was immediately available for consultation/collaboration.  Caley Ciaramitaro, MD 03/22/12 1720 

## 2012-05-05 NOTE — ED Provider Notes (Signed)
Medical screening examination/treatment/procedure(s) were performed by non-physician practitioner and as supervising physician I was immediately available for consultation/collaboration.  Raeford Razor, MD 05/05/12 [phone]

## 2012-06-25 ENCOUNTER — Emergency Department (HOSPITAL_COMMUNITY)
Admission: EM | Admit: 2012-06-25 | Discharge: 2012-06-25 | Disposition: A | Discharge status: Home / Self Care | Payer: Self-pay | Attending: Emergency Medicine | Admitting: Emergency Medicine

## 2012-06-25 ENCOUNTER — Encounter (HOSPITAL_COMMUNITY): Payer: Self-pay | Admitting: Emergency Medicine

## 2012-06-25 DIAGNOSIS — S01511A Laceration without foreign body of lip, initial encounter: Secondary | ICD-10-CM

## 2012-06-25 DIAGNOSIS — F3289 Other specified depressive episodes: Secondary | ICD-10-CM | POA: Insufficient documentation

## 2012-06-25 DIAGNOSIS — F329 Major depressive disorder, single episode, unspecified: Secondary | ICD-10-CM | POA: Insufficient documentation

## 2012-06-25 DIAGNOSIS — S01501A Unspecified open wound of lip, initial encounter: Secondary | ICD-10-CM | POA: Insufficient documentation

## 2012-06-25 DIAGNOSIS — X58XXXA Exposure to other specified factors, initial encounter: Secondary | ICD-10-CM | POA: Insufficient documentation

## 2012-06-25 DIAGNOSIS — Z87891 Personal history of nicotine dependence: Secondary | ICD-10-CM | POA: Insufficient documentation

## 2012-06-25 MED ORDER — PENICILLIN V POTASSIUM 500 MG PO TABS: 500.0000 mg | ORAL_TABLET | Freq: Three times a day (TID) | ORAL | Status: AC

## 2012-06-25 MED ORDER — HYDROCODONE-ACETAMINOPHEN 5-500 MG PO TABS: 1.0000 | ORAL_TABLET | Freq: Four times a day (QID) | ORAL | Status: AC | PRN
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
## 2012-07-09 ENCOUNTER — Emergency Department (HOSPITAL_COMMUNITY): Payer: Medicaid Other

## 2012-07-09 ENCOUNTER — Encounter (HOSPITAL_COMMUNITY): Payer: Self-pay | Admitting: *Deleted

## 2012-07-09 ENCOUNTER — Emergency Department (HOSPITAL_COMMUNITY)
Admission: EM | Admit: 2012-07-09 | Discharge: 2012-07-09 | Disposition: A | Discharge status: Home / Self Care | Payer: Medicaid Other | Attending: Emergency Medicine | Admitting: Emergency Medicine

## 2012-07-09 DIAGNOSIS — R002 Palpitations: Secondary | ICD-10-CM | POA: Insufficient documentation

## 2012-07-09 DIAGNOSIS — F3289 Other specified depressive episodes: Secondary | ICD-10-CM | POA: Insufficient documentation

## 2012-07-09 DIAGNOSIS — F329 Major depressive disorder, single episode, unspecified: Secondary | ICD-10-CM | POA: Insufficient documentation

## 2012-07-09 DIAGNOSIS — Z809 Family history of malignant neoplasm, unspecified: Secondary | ICD-10-CM | POA: Insufficient documentation

## 2012-07-09 DIAGNOSIS — Z8249 Family history of ischemic heart disease and other diseases of the circulatory system: Secondary | ICD-10-CM | POA: Insufficient documentation

## 2012-07-09 DIAGNOSIS — Z87891 Personal history of nicotine dependence: Secondary | ICD-10-CM | POA: Insufficient documentation

## 2012-07-09 DIAGNOSIS — F411 Generalized anxiety disorder: Secondary | ICD-10-CM | POA: Insufficient documentation

## 2012-07-09 LAB — POCT I-STAT, CHEM 8
Creatinine, Ser: 0.7 mg/dL (ref 0.50–1.10)
Hemoglobin: 15.6 g/dL — ABNORMAL HIGH (ref 12.0–15.0)
Sodium: 136 mEq/L (ref 135–145)
TCO2: 22 mmol/L (ref 0–100)

## 2012-07-09 MED ORDER — LORAZEPAM 2 MG/ML IJ SOLN
0.5000 mg | Freq: Once | INTRAMUSCULAR | Status: AC
  Administered 2012-07-09: 0.5 mg via INTRAVENOUS
  Filled 2012-07-09: qty 1

## 2012-07-09 NOTE — ED Notes (Signed)
Pt reports since last night, has had sensation of heart racing, chest feeling tight, and feeling sob intermittently. 100% RA O2, HR 103. Hx anxiety.

## 2012-07-09 NOTE — ED Provider Notes (Signed)
History     CSN: [id number]  Arrival date & time 07/09/12  0900   First MD Initiated Contact with Patient 07/09/12 [phone]      Chief Complaint  Patient presents with  . Palpitations    (Consider location/radiation/quality/duration/timing/severity/associated sxs/prior treatment) Patient is a 26 y.o. female presenting with palpitations. The history is provided by the patient.  Palpitations  This is a new problem. The current episode started yesterday. The problem occurs constantly. The problem has not changed since onset.The problem is associated with anxiety. Associated symptoms include numbness, chest pressure and shortness of breath. Pertinent negatives include no diaphoresis, no fever, no malaise/fatigue, no near-syncope, no syncope, no abdominal pain, no vomiting, no leg pain, no dizziness, no cough and no hemoptysis. She has tried nothing for the symptoms. The treatment provided no relief. Risk factors include no known risk factors (PT without recent travel.  No history of PE or DVT.  ). Her past medical history does not include anemia, heart disease, hyperthyroidism or valve disorder.  She has felt some tingling.  She has had some anxiety in the past but has not had episodes like this.  No excessive caffeine intake or drug use.  Only sporadic alcohol use.  Past Medical History  Diagnosis Date  . Depression     Past Surgical History  Procedure Date  . Cesarean section     Family History  Problem Relation Age of Onset  . Alcohol abuse Father   . Drug abuse Father   . Hypertension Paternal Grandmother   . Cancer Maternal Grandmother   . Cancer Maternal Grandfather     History  Substance Use Topics  . Smoking status: Former Smoker -- 0.2 packs/day for 10 years    Types: Cigarettes    Quit date: 12/27/2011  . Smokeless tobacco: Never Used  . Alcohol Use: 3.0 oz/week    5 Cans of beer per week    OB History    Grav Para Term Preterm Abortions TAB SAB Ect Mult Living   1                Review of Systems  Constitutional: Negative for fever, malaise/fatigue and diaphoresis.  Respiratory: Positive for shortness of breath. Negative for cough and hemoptysis.   Cardiovascular: Positive for palpitations. Negative for syncope and near-syncope.  Gastrointestinal: Negative for vomiting and abdominal pain.  Neurological: Positive for numbness. Negative for dizziness.  All other systems reviewed and are negative.    Allergies  Review of patient's allergies indicates no known allergies.  Home Medications   Current Outpatient Rx  Name Route Sig Dispense Refill  . CITALOPRAM HYDROBROMIDE 20 MG PO TABS Oral Take 30 mg by mouth See admin instructions. Pt takes 1.5 tabs of 20 mg tablet    . LEVONORGESTREL 20 MCG/24HR IU IUD Intrauterine 1 each by Intrauterine route once. 1 each 0    LMP 06/02/2012  Physical Exam  Nursing note and vitals reviewed. Constitutional: She appears well-developed and well-nourished. No distress.  HENT:  Head: Normocephalic and atraumatic.  Right Ear: External ear normal.  Left Ear: External ear normal.  Eyes: Conjunctivae are normal. Right eye exhibits no discharge. Left eye exhibits no discharge. No scleral icterus.  Neck: Neck supple. No tracheal deviation present. No thyromegaly present.  Cardiovascular: Normal rate, regular rhythm and intact distal pulses.   Pulmonary/Chest: Effort normal and breath sounds normal. No stridor. No respiratory distress. She has no wheezes. She has no rales.  Abdominal: Soft. Bowel sounds  are normal. She exhibits no distension. There is no tenderness. There is no rebound and no guarding.  Musculoskeletal: She exhibits no edema and no tenderness.  Neurological: She is alert. She has normal strength. No sensory deficit. Cranial nerve deficit:  no gross defecits noted. She exhibits normal muscle tone. She displays no seizure activity. Coordination normal.  Skin: Skin is warm and dry. No rash noted.    Psychiatric: Her speech is normal and behavior is normal. Judgment normal. Her mood appears anxious. Her affect is not labile and not inappropriate. She is not agitated and not aggressive. Cognition and memory are normal. She does not exhibit a depressed mood. She expresses no suicidal ideation.    ED Course  Procedures (including critical care time)  Rate: 103  Rhythm: sinus tach  AXIS: nl  Intervals: normal  ST/T Wave abnormalities: normal  Conduction Disutrbances:none  Narrative Interpretation:   Old EKG Reviewed: none available  Labs Reviewed  POCT I-STAT, CHEM 8 - Abnormal; Notable for the following:    BUN 5 (*)     Hemoglobin 15.6 (*)     All other components within normal limits   Dg Chest 2 View  07/09/2012  *RADIOLOGY REPORT*  Clinical Data: Chest tightness and shortness of breath, smoking history  CHEST - 2 VIEW  Comparison: Chest x-ray of 07/25/2010  Findings: No active infiltrate or effusion is seen.  Mediastinal contours appear stable.  The heart is within normal limits in size. No bony abnormality is noted.  IMPRESSION: No active lung disease.  Original Report Authenticated By: Juline Patch, M.D.     1. Palpitation       MDM  Patient without evidence of any acute emergency medical condition. Is possible that her symptoms are related to anxiety. She has no evidence of anemia. There is no evidence of pneumonia. She does not appear to have any acute cardiac abnormalities other than a mild sinus tachycardia. Patient has no risk factors for PE.  I have low suspicion.  PERC negative.  I will have her followup with her mental health provider considering the fact that anxiety may be a component.     Celene Kras, MD 07/09/12 1124

## 2012-07-09 NOTE — ED Notes (Signed)
Pt not in room yet.

## 2012-08-08 NOTE — ED Provider Notes (Signed)
Medical screening examination/treatment/procedure(s) were performed by non-physician practitioner and as supervising physician I was immediately available for consultation/collaboration.  Raeford Razor, MD 08/08/12 [phone]

## 2013-08-22 ENCOUNTER — Encounter (HOSPITAL_COMMUNITY): Payer: Self-pay | Admitting: Emergency Medicine

## 2013-08-22 ENCOUNTER — Emergency Department (HOSPITAL_COMMUNITY)
Admission: EM | Admit: 2013-08-22 | Discharge: 2013-08-22 | Disposition: A | Discharge status: Home / Self Care | Payer: No Typology Code available for payment source | Attending: Emergency Medicine | Admitting: Emergency Medicine

## 2013-08-22 DIAGNOSIS — Z79899 Other long term (current) drug therapy: Secondary | ICD-10-CM | POA: Insufficient documentation

## 2013-08-22 DIAGNOSIS — Y92009 Unspecified place in unspecified non-institutional (private) residence as the place of occurrence of the external cause: Secondary | ICD-10-CM | POA: Insufficient documentation

## 2013-08-22 DIAGNOSIS — Z87891 Personal history of nicotine dependence: Secondary | ICD-10-CM | POA: Insufficient documentation

## 2013-08-22 DIAGNOSIS — W57XXXA Bitten or stung by nonvenomous insect and other nonvenomous arthropods, initial encounter: Secondary | ICD-10-CM | POA: Insufficient documentation

## 2013-08-22 DIAGNOSIS — R21 Rash and other nonspecific skin eruption: Secondary | ICD-10-CM | POA: Insufficient documentation

## 2013-08-22 DIAGNOSIS — F3289 Other specified depressive episodes: Secondary | ICD-10-CM | POA: Insufficient documentation

## 2013-08-22 DIAGNOSIS — T148 Other injury of unspecified body region: Secondary | ICD-10-CM | POA: Insufficient documentation

## 2013-08-22 DIAGNOSIS — F329 Major depressive disorder, single episode, unspecified: Secondary | ICD-10-CM | POA: Insufficient documentation

## 2013-08-22 DIAGNOSIS — Y939 Activity, unspecified: Secondary | ICD-10-CM | POA: Insufficient documentation

## 2013-08-22 MED ORDER — DEXAMETHASONE SODIUM PHOSPHATE 10 MG/ML IJ SOLN: 10.0000 mg | Freq: Once | INTRAMUSCULAR | Status: DC

## 2013-08-22 MED ORDER — DEXAMETHASONE SODIUM PHOSPHATE 10 MG/ML IJ SOLN
10.0000 mg | Freq: Once | INTRAMUSCULAR | Status: AC
  Administered 2013-08-22: 10 mg via INTRAMUSCULAR
  Filled 2013-08-22: qty 1

## 2013-08-22 NOTE — ED Provider Notes (Signed)
CSN: [id number]     Arrival date & time 08/22/13  1354 History   First MD Initiated Contact with Patient 08/22/13 1407     Chief Complaint  Patient presents with  . bed bugs    (Consider location/radiation/quality/duration/timing/severity/associated sxs/prior Treatment) HPI Comments: Patient presents with bed bug bites on entire body. She states that her father had bed bugs in his house. She has been staying there for 2 nights. Upon awaking she has noted very itchy spots scattered on her body. She has been using hydrocortisone cream and Benadryl cream and oral Benadryl with relief. No fever, nausea, vomiting. No new medications or exposures. No tick bite. Onset of symptoms acute. Course is constant. Nothing makes symptoms better or worse.  The history is provided by the patient.    Past Medical History  Diagnosis Date  . Depression    Past Surgical History  Procedure Laterality Date  . Cesarean section     Family History  Problem Relation Age of Onset  . Alcohol abuse Father   . Drug abuse Father   . Hypertension Paternal Grandmother   . Cancer Maternal Grandmother   . Cancer Maternal Grandfather    History  Substance Use Topics  . Smoking status: Former Smoker -- 0.25 packs/day for 10 years    Types: Cigarettes    Quit date: 12/27/2011  . Smokeless tobacco: Never Used  . Alcohol Use: 3.0 oz/week    5 Cans of beer per week   OB History   Grav Para Term Preterm Abortions TAB SAB Ect Mult Living   1              Review of Systems  Constitutional: Negative for fever.  HENT: Negative for facial swelling and trouble swallowing.   Eyes: Negative for redness.  Respiratory: Negative for shortness of breath, wheezing and stridor.   Cardiovascular: Negative for chest pain.  Gastrointestinal: Negative for nausea and vomiting.  Musculoskeletal: Negative for myalgias.  Skin: Positive for rash.  Neurological: Negative for light-headedness.  Psychiatric/Behavioral: Negative for  confusion.    Allergies  Review of patient's allergies indicates no known allergies.  Home Medications   Current Outpatient Rx  Name  Route  Sig  Dispense  Refill  . citalopram (CELEXA) 20 MG tablet   Oral   Take 30 mg by mouth daily. Pt takes 1.5 tabs of 20 mg tablet          BP 127/69  Pulse 113  Temp(Src) 98.5 F (36.9 C) (Oral)  Resp 18  SpO2 98% Physical Exam  Nursing note and vitals reviewed. Constitutional: She appears well-developed and well-nourished.  HENT:  Head: Normocephalic and atraumatic.  Eyes: Conjunctivae are normal.  Neck: Normal range of motion. Neck supple.  Pulmonary/Chest: No respiratory distress.  Neurological: She is alert.  Skin: Skin is warm and dry.  Scattered papules, excoriations in some areas, on torso, arms, legs, right cheek. Several are grouped. Spares palms and soles.  Psychiatric: She has a normal mood and affect.    ED Course  Procedures (including critical care time) Labs Review Labs Reviewed - No data to display Imaging Review No results found.  2:17 PM Patient seen and examined. Patient requests something for symptoms that is not a cream. Will give decadron in hopes of decreasing reaction. No contraindications to this medication. Medications ordered.   Vital signs reviewed and are as follows: Filed Vitals:   08/22/13 1404  BP: 127/69  Pulse: 113  Temp: 98.5 F (36.9  C)  Resp: 18   Counseled to continue oral Benadryl.  MDM   1. Bedbug bite    Patient with bumps and history suggestive of bed bug bites. No systemic symptoms of illness. No concern for systemic infection, tickborne illness, SJS/TEN.    Renne Crigler, PA-C 08/22/13 [phone]

## 2013-08-22 NOTE — ED Notes (Signed)
Pt states she thinks she has bed bugs. Has multiple spots on arms legs and back. Reddened.

## 2013-08-22 NOTE — ED Provider Notes (Signed)
Medical screening examination/treatment/procedure(s) were performed by non-physician practitioner and as supervising physician I was immediately available for consultation/collaboration.   Enid Skeens, MD 08/22/13 2150

## 2013-09-11 ENCOUNTER — Emergency Department (HOSPITAL_COMMUNITY)
Admission: EM | Admit: 2013-09-11 | Discharge: 2013-09-11 | Disposition: A | Discharge status: Home / Self Care | Payer: Medicaid Other | Attending: Emergency Medicine | Admitting: Emergency Medicine

## 2013-09-11 ENCOUNTER — Encounter (HOSPITAL_COMMUNITY): Payer: Self-pay | Admitting: Emergency Medicine

## 2013-09-11 DIAGNOSIS — Z3202 Encounter for pregnancy test, result negative: Secondary | ICD-10-CM | POA: Insufficient documentation

## 2013-09-11 DIAGNOSIS — Z8659 Personal history of other mental and behavioral disorders: Secondary | ICD-10-CM | POA: Insufficient documentation

## 2013-09-11 DIAGNOSIS — F1010 Alcohol abuse, uncomplicated: Secondary | ICD-10-CM | POA: Insufficient documentation

## 2013-09-11 DIAGNOSIS — Z87891 Personal history of nicotine dependence: Secondary | ICD-10-CM | POA: Insufficient documentation

## 2013-09-11 LAB — CBC
MCV: 96 fL (ref 78.0–100.0)
Platelets: 232 10*3/uL (ref 150–400)
RDW: 12.8 % (ref 11.5–15.5)
WBC: 5.4 10*3/uL (ref 4.0–10.5)

## 2013-09-11 LAB — RAPID URINE DRUG SCREEN, HOSP PERFORMED
Amphetamines: NOT DETECTED
Benzodiazepines: NOT DETECTED
Cocaine: NOT DETECTED
Opiates: NOT DETECTED

## 2013-09-11 LAB — COMPREHENSIVE METABOLIC PANEL
AST: 32 U/L (ref 0–37)
Albumin: 3.8 g/dL (ref 3.5–5.2)
Chloride: 100 mEq/L (ref 96–112)
Creatinine, Ser: 0.64 mg/dL (ref 0.50–1.10)
Total Bilirubin: 0.3 mg/dL (ref 0.3–1.2)
Total Protein: 6.8 g/dL (ref 6.0–8.3)

## 2013-09-11 LAB — POCT PREGNANCY, URINE: Preg Test, Ur: NEGATIVE

## 2013-09-11 LAB — SALICYLATE LEVEL: Salicylate Lvl: <2 mg/dL — ABNORMAL LOW (ref 2.8–20.0)

## 2013-09-11 LAB — ACETAMINOPHEN LEVEL: Acetaminophen (Tylenol), Serum: <15 ug/mL (ref 10–30)

## 2013-09-11 MED ORDER — LORAZEPAM 1 MG PO TABS: 1.0000 mg | ORAL_TABLET | Freq: Four times a day (QID) | ORAL | Status: DC | PRN

## 2013-09-11 MED ORDER — THIAMINE HCL 100 MG/ML IJ SOLN: 100.0000 mg | Freq: Every day | INTRAMUSCULAR | Status: DC

## 2013-09-11 MED ORDER — ADULT MULTIVITAMIN W/MINERALS CH: 1.0000 | ORAL_TABLET | Freq: Every day | ORAL | Status: DC

## 2013-09-11 MED ORDER — VITAMIN B-1 100 MG PO TABS: 100.0000 mg | ORAL_TABLET | Freq: Every day | ORAL | Status: DC

## 2013-09-11 MED ORDER — DIPHENHYDRAMINE HCL 25 MG PO CAPS
25.0000 mg | ORAL_CAPSULE | Freq: Once | ORAL | Status: AC
  Administered 2013-09-11: 25 mg via ORAL
  Filled 2013-09-11: qty 1

## 2013-09-11 MED ORDER — LORAZEPAM 1 MG PO TABS: 0.0000 mg | ORAL_TABLET | Freq: Two times a day (BID) | ORAL | Status: DC

## 2013-09-11 MED ORDER — LORAZEPAM 1 MG PO TABS: 0.0000 mg | ORAL_TABLET | Freq: Four times a day (QID) | ORAL | Status: DC

## 2013-09-11 MED ORDER — LORAZEPAM 2 MG/ML IJ SOLN: 1.0000 mg | Freq: Four times a day (QID) | INTRAMUSCULAR | Status: DC | PRN

## 2013-09-11 MED ORDER — FOLIC ACID 1 MG PO TABS: 1.0000 mg | ORAL_TABLET | Freq: Every day | ORAL | Status: DC

## 2013-09-11 NOTE — ED Notes (Signed)
Pt here requesting detox from ETOH; pt sts drinks approx 2 bottles of wine a day; pt sts went to treatment and still blew 0.31 and sent here for detox; pt sts no ETOH x 3 days; pt denies SI/HI
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
## 2013-09-11 NOTE — [facility] Counselor (Signed)
Writer conferred with Dr. Anitra Lauth and pt will be discharged with outpatient referrals. Pt does not meet criteria for inpatient. Denice Bors, [facility] 09/11/2013 4:55 PM

## 2013-09-11 NOTE — ED Provider Notes (Signed)
CSN: [id number]     Arrival date & time 09/11/13  1159 History   First MD Initiated Contact with Patient 09/11/13 1223     Chief Complaint  Patient presents with  . Medical Clearance  . Alcohol Problem   (Consider location/radiation/quality/duration/timing/severity/associated sxs/prior Treatment) HPI Comments: 27 year old female presents for medical clearance. She states [facility] but they told her her alcohol level was 0.03 100 breathalyzer. Due to this they sent her over here to be medically cleared for detox. Patient denies any suicidal or homicidal ideation. She states she feels well and has not had any alcohol in the last 3 days. She said she had "a lot of alcohol" 3 days ago, which include the bottle that is looped in the bottle of wine. She denies any tremors, hallucinations, or other signs of withdrawal. She does have multiple insect bites and says that these are itching but she does not feel like there's anything crawling on her.   Past Medical History  Diagnosis Date  . Depression   . ETOH abuse    Past Surgical History  Procedure Laterality Date  . Cesarean section     Family History  Problem Relation Age of Onset  . Alcohol abuse Father   . Drug abuse Father   . Hypertension Paternal Grandmother   . Cancer Maternal Grandmother   . Cancer Maternal Grandfather    History  Substance Use Topics  . Smoking status: Former Smoker -- 0.25 packs/day for 10 years    Types: Cigarettes    Quit date: 12/27/2011  . Smokeless tobacco: Never Used  . Alcohol Use: 3.0 oz/week    5 Cans of beer per week   OB History   Grav Para Term Preterm Abortions TAB SAB Ect Mult Living   1              Review of Systems  Constitutional: Negative for fever.  Gastrointestinal: Negative for vomiting.  Neurological: Negative for tremors and weakness.  Psychiatric/Behavioral: Negative for suicidal ideas and hallucinations. The patient is not nervous/anxious.    All other systems reviewed and are negative.    Allergies  Review of patient's allergies indicates no known allergies.  Home Medications  No current outpatient prescriptions on file. BP 129/72  Pulse 88  Temp(Src) 98.2 F (36.8 C) (Oral)  Resp 20  Ht 5' 1.25" (1.556 m)  Wt 152 lb 6 oz (69.117 kg)  BMI 28.55 kg/m2  SpO2 99% Physical Exam  Nursing note and vitals reviewed. Constitutional: She is oriented to person, place, and time. She appears well-developed and well-nourished. No distress.  HENT:  Head: Normocephalic and atraumatic.  Right Ear: External ear normal.  Left Ear: External ear normal.  Nose: Nose normal.  Eyes: Right eye exhibits no discharge. Left eye exhibits no discharge.  Cardiovascular: Normal rate, regular rhythm and normal heart sounds.   Pulmonary/Chest: Effort normal and breath sounds normal.  Abdominal: Soft. There is no tenderness.  Neurological: She is alert and oriented to person, place, and time. She displays no tremor.  Skin: Skin is warm and dry.  Multiple insect bites on extremities  Psychiatric: Her behavior is normal. She is not actively hallucinating. Thought content is not delusional. She expresses no homicidal and no suicidal ideation.    ED Course  Procedures (including critical care time) Labs Review Labs Reviewed  CBC - Abnormal; Notable for the following:    RBC 3.74 (*)    HCT 35.9 (*)  All other components within normal limits  SALICYLATE LEVEL - Abnormal; Notable for the following:    Salicylate Lvl <2.0 (*)    All other components within normal limits  URINE RAPID DRUG SCREEN (HOSP PERFORMED) - Abnormal; Notable for the following:    Tetrahydrocannabinol POSITIVE (*)    All other components within normal limits  ACETAMINOPHEN LEVEL  COMPREHENSIVE METABOLIC PANEL  ETHANOL  POCT PREGNANCY, URINE   Imaging Review No results found.  MDM   1. Alcohol abuse    Patient has negative ETOH level, no SI or HI. ACT  evaluated,  No indications for inpatient treatment. Patient wants outpatient treatment, will f/u with [facility]. No signs of withdrawal in ED.     Audree Camel, MD 09/11/13 2207

## 2013-09-11 NOTE — ED Notes (Signed)
Pt ready for discharge. [facility] at [facility] will pick pt up. Pt aware of plan of care

## 2013-09-19 ENCOUNTER — Emergency Department (HOSPITAL_BASED_OUTPATIENT_CLINIC_OR_DEPARTMENT_OTHER)
Admission: EM | Admit: 2013-09-19 | Discharge: 2013-09-19 | Disposition: A | Discharge status: Home / Self Care | Payer: Self-pay | Attending: Emergency Medicine | Admitting: Emergency Medicine

## 2013-09-19 ENCOUNTER — Encounter (HOSPITAL_BASED_OUTPATIENT_CLINIC_OR_DEPARTMENT_OTHER): Payer: Self-pay | Admitting: Emergency Medicine

## 2013-09-19 DIAGNOSIS — A499 Bacterial infection, unspecified: Secondary | ICD-10-CM | POA: Insufficient documentation

## 2013-09-19 DIAGNOSIS — Z87891 Personal history of nicotine dependence: Secondary | ICD-10-CM | POA: Insufficient documentation

## 2013-09-19 DIAGNOSIS — B9689 Other specified bacterial agents as the cause of diseases classified elsewhere: Secondary | ICD-10-CM | POA: Insufficient documentation

## 2013-09-19 DIAGNOSIS — Z3202 Encounter for pregnancy test, result negative: Secondary | ICD-10-CM | POA: Insufficient documentation

## 2013-09-19 DIAGNOSIS — N760 Acute vaginitis: Secondary | ICD-10-CM | POA: Insufficient documentation

## 2013-09-19 LAB — URINALYSIS, ROUTINE W REFLEX MICROSCOPIC
Glucose, UA: NEGATIVE mg/dL
Ketones, ur: NEGATIVE mg/dL
Nitrite: NEGATIVE
Protein, ur: NEGATIVE mg/dL
Urobilinogen, UA: 0.2 mg/dL (ref 0.0–1.0)

## 2013-09-19 LAB — URINE MICROSCOPIC-ADD ON

## 2013-09-19 LAB — PREGNANCY, URINE: Preg Test, Ur: NEGATIVE

## 2013-09-19 LAB — WET PREP, GENITAL: Trich, Wet Prep: NONE SEEN

## 2013-09-19 MED ORDER — METRONIDAZOLE 500 MG PO TABS: 500.0000 mg | ORAL_TABLET | Freq: Two times a day (BID) | ORAL | Status: DC

## 2013-09-19 NOTE — ED Provider Notes (Signed)
Medical screening examination/treatment/procedure(s) were performed by non-physician practitioner and as supervising physician I was immediately available for consultation/collaboration.   Jann Ra B. Bernette Mayers, MD 09/19/13 2358

## 2013-09-19 NOTE — ED Notes (Signed)
Pt c/o vaginal discharge x 5 days. Pt sts she "I slept with someone that I hadn't been sleeping with in a while and this usually happens after I have sex with him."  Pt tried monistat with no relief. Pt denies fever, N/V, Blood in urine or discharge. A&Ox4. NAD noted. Pain 0/10, but has been experiencing suprapubic discomfort.

## 2013-09-19 NOTE — ED Notes (Signed)
Pelvic cart at bedside. 

## 2013-09-19 NOTE — ED Provider Notes (Signed)
CSN: [id number]     Arrival date & time 09/19/13  1645 History   First MD Initiated Contact with Patient 09/19/13 1648     Chief Complaint  Patient presents with  . Vaginal Itching  . Vaginal Discharge   (Consider location/radiation/quality/duration/timing/severity/associated sxs/prior Treatment) Patient is a 26 y.o. female presenting with vaginal itching and vaginal discharge. The history is provided by the patient. No language interpreter was used.  Vaginal Itching This is a new problem. The current episode started in the past 7 days. The problem occurs constantly. The problem has been gradually worsening. Pertinent negatives include no abdominal pain, change in bowel habit, chills, diaphoresis, fever, nausea, urinary symptoms, vomiting or weakness. Nothing aggravates the symptoms. Treatments tried: Monistat. The treatment provided no relief.  Vaginal Discharge Quality:  White Severity:  Mild Onset quality:  Gradual Timing:  Intermittent Progression:  Unchanged Chronicity:  New Relieved by:  Nothing Worsened by:  Nothing tried Ineffective treatments: monistat. Associated symptoms: vaginal itching   Associated symptoms: no abdominal pain, no dyspareunia, no dysuria, no fever, no genital lesions, no nausea, no urinary frequency, no urinary incontinence and no vomiting     History reviewed. No pertinent past medical history. Past Surgical History  Procedure Laterality Date  . Cesarean section     History reviewed. No pertinent family history. History  Substance Use Topics  . Smoking status: Former Games developer  . Smokeless tobacco: Not on file  . Alcohol Use: No   OB History   Grav Para Term Preterm Abortions TAB SAB Ect Mult Living                 Review of Systems  Constitutional: Negative for fever, chills and diaphoresis.  Gastrointestinal: Negative for nausea, vomiting, abdominal pain and change in bowel habit.  Genitourinary: Positive for vaginal discharge. Negative for  bladder incontinence, dysuria and dyspareunia.  Neurological: Negative for weakness.  All other systems reviewed and are negative.    Allergies  Review of patient's allergies indicates no known allergies.  Home Medications   Current Outpatient Rx  Name  Route  Sig  Dispense  Refill  . metroNIDAZOLE (FLAGYL) 500 MG tablet   Oral   Take 1 tablet (500 mg total) by mouth 2 (two) times daily.   14 tablet   0    BP 109/71  Pulse 85  Temp(Src) 98.9 F (37.2 C) (Oral)  SpO2 99%  LMP 08/20/2013  Physical Exam  Nursing note and vitals reviewed. Constitutional: She is oriented to person, place, and time. She appears well-developed and well-nourished. No distress.  HENT:  Head: Normocephalic and atraumatic.  Eyes: Conjunctivae and EOM are normal. No scleral icterus.  Neck: Normal range of motion.  Cardiovascular: Normal rate, regular rhythm and intact distal pulses.   Pulmonary/Chest: Effort normal. No respiratory distress.  Abdominal: Normal appearance. There is no tenderness.  Genitourinary: Uterus normal. There is no rash, tenderness, lesion or injury on the right labia. There is no rash, tenderness, lesion or injury on the left labia. Cervix exhibits no motion tenderness, no discharge and no friability. Right adnexum displays no mass, no tenderness and no fullness. Left adnexum displays no mass, no tenderness and no fullness. No tenderness or bleeding around the vagina. Vaginal discharge (scant white d/c in vaginal vault) found.  IUD strings appreciated from cervical os  Musculoskeletal: Normal range of motion.  Neurological: She is alert and oriented to person, place, and time.  Skin: Skin is warm and dry. No rash noted.  She is not diaphoretic. No erythema. No pallor.  Psychiatric: She has a normal mood and affect. Her behavior is normal.    ED Course  Procedures (including critical care time) Labs Review Labs Reviewed  WET PREP, GENITAL - Abnormal; Notable for the  following:    Clue Cells Wet Prep HPF POC TOO NUMEROUS TO COUNT (*)    WBC, Wet Prep HPF POC TOO NUMEROUS TO COUNT (*)    All other components within normal limits  URINALYSIS, ROUTINE W REFLEX MICROSCOPIC - Abnormal; Notable for the following:    Specific Gravity, Urine 1.004 (*)    Leukocytes, UA TRACE (*)    All other components within normal limits  URINE MICROSCOPIC-ADD ON - Abnormal; Notable for the following:    Bacteria, UA FEW (*)    All other components within normal limits  GC/CHLAMYDIA PROBE AMP  URINE CULTURE  PREGNANCY, URINE   Imaging Review No results found.  EKG Interpretation   None       MDM   1. Bacterial vaginosis    Patient presents for vaginal itching with vaginal discharge. She is well and nontoxic appearing, hemodynamically stable, and afebrile. No abdominal tenderness elicited as well as no adnexal tenderness on exam. Urinalysis nonsuggestive of infection. Wet prep significant for TNTC clue cells consistent with bacterial vaginosis. Patient appropriate for discharge with OB/GYN followup and prescription for Flagyl to take for symptoms. Return precautions discussed and patient agreeable to plan with no unaddressed concerns.    Antony Madura, PA-C 09/19/13 [phone]

## 2013-09-19 NOTE — ED Notes (Signed)
PT states that she has noticed a vaginal odor, itching, and discharge. Pt states that this is similar to when she has had an infection in the past.

## 2013-09-20 LAB — URINE CULTURE
Colony Count: NO GROWTH
Culture: NO GROWTH

## 2014-10-06 ENCOUNTER — Encounter (HOSPITAL_COMMUNITY): Payer: Self-pay | Admitting: Emergency Medicine

## 2015-05-29 ENCOUNTER — Encounter (HOSPITAL_COMMUNITY): Payer: Self-pay | Admitting: Emergency Medicine

## 2015-05-29 ENCOUNTER — Emergency Department (INDEPENDENT_AMBULATORY_CARE_PROVIDER_SITE_OTHER)
Admission: EM | Admit: 2015-05-29 | Discharge: 2015-05-29 | Disposition: A | Discharge status: Home / Self Care | Payer: Managed Care, Other (non HMO) | Source: Home / Self Care | Attending: Family Medicine | Admitting: Family Medicine

## 2015-05-29 DIAGNOSIS — J020 Streptococcal pharyngitis: Secondary | ICD-10-CM

## 2015-05-29 LAB — POCT RAPID STREP A: STREPTOCOCCUS, GROUP A SCREEN (DIRECT): POSITIVE — AB

## 2015-05-29 MED ORDER — AMOXICILLIN 500 MG PO CAPS: 500.0000 mg | ORAL_CAPSULE | Freq: Three times a day (TID) | ORAL | Status: DC

## 2015-05-29 NOTE — ED Provider Notes (Signed)
CSN: [id number]     Arrival date & time 05/29/15  1544 History   First MD Initiated Contact with Patient 05/29/15 1601     Chief Complaint  Patient presents with  . Fever  . Headache  . Sore Throat   (Consider location/radiation/quality/duration/timing/severity/associated sxs/prior Treatment) Patient is a 29 y.o. female presenting with fever, headaches, and pharyngitis. The history is provided by the patient.  Fever Max temp prior to arrival:  102.4 Temp source:  Oral Severity:  Moderate Onset quality:  Sudden Duration:  2 days Progression:  Worsening Chronicity:  New Relieved by:  None tried Worsened by:  Nothing tried Ineffective treatments:  None tried Associated symptoms: headaches and sore throat   Associated symptoms: no rash   Risk factors: sick contacts   Risk factors comment:  Child has strep Headache Associated symptoms: fever and sore throat   Sore Throat Associated symptoms include headaches.    Past Medical History  Diagnosis Date  . Depression   . ETOH abuse    Past Surgical History  Procedure Laterality Date  . Cesarean section     Family History  Problem Relation Age of Onset  . Alcohol abuse Father   . Drug abuse Father   . Hypertension Paternal Grandmother   . Cancer Maternal Grandmother   . Cancer Maternal Grandfather    History  Substance Use Topics  . Smoking status: Former Smoker -- 0.25 packs/day for 10 years    Types: Cigarettes    Quit date: 12/27/2011  . Smokeless tobacco: Never Used  . Alcohol Use: 3.0 oz/week    5 Cans of beer per week   OB History    Gravida Para Term Preterm AB TAB SAB Ectopic Multiple Living   1              Review of Systems  Constitutional: Positive for fever.  HENT: Positive for sore throat.   Skin: Negative for rash.  Neurological: Positive for headaches.  Hematological: Positive for adenopathy.    Allergies  Review of patient's allergies indicates no known allergies.  Home Medications    Prior to Admission medications   Medication Sig Start Date End Date Taking? Authorizing Provider  amoxicillin (AMOXIL) 500 MG capsule Take 1 capsule (500 mg total) by mouth 3 (three) times daily. 05/29/15   Linna Hoff, MD   BP 114/68 mmHg  Pulse 114  Temp(Src) 99.4 F (37.4 C) (Oral)  Resp 20  SpO2 100% Physical Exam  Constitutional: She is oriented to person, place, and time. She appears well-developed and well-nourished. She appears distressed.  HENT:  Right Ear: External ear normal.  Left Ear: External ear normal.  Mouth/Throat: Uvula is midline and mucous membranes are normal. Oropharyngeal exudate and posterior oropharyngeal erythema present.  Eyes: Conjunctivae and EOM are normal. Pupils are equal, round, and reactive to light.  Lymphadenopathy:    She has cervical adenopathy.  Neurological: She is alert and oriented to person, place, and time.  Skin: Skin is warm and dry. No rash noted.  Nursing note and vitals reviewed.   ED Course  Procedures (including critical care time) Labs Review Labs Reviewed  POCT RAPID STREP A - Abnormal; Notable for the following:    Streptococcus, Group A Screen (Direct) POSITIVE (*)    All other components within normal limits    Imaging Review No results found.   MDM   1. Streptococcal sore throat       Linna Hoff, MD 05/29/15 [phone]

## 2015-05-29 NOTE — Discharge Instructions (Signed)
Drink lots of fluids, take all of medicine, use lozenges as needed.return if needed °

## 2015-05-29 NOTE — ED Notes (Signed)
Headache, sore throat, nausea, fever, and fatigue.  Patient has a child that has strep.

## 2016-05-09 ENCOUNTER — Other Ambulatory Visit: Payer: Self-pay | Admitting: Obstetrics & Gynecology

## 2016-05-10 LAB — CYTOLOGY - PAP

## 2016-05-18 ENCOUNTER — Other Ambulatory Visit: Payer: Self-pay | Admitting: Obstetrics & Gynecology

## 2016-05-31 ENCOUNTER — Encounter (HOSPITAL_COMMUNITY): Payer: Self-pay

## 2016-06-28 ENCOUNTER — Encounter (HOSPITAL_COMMUNITY): Payer: Self-pay

## 2016-06-28 ENCOUNTER — Ambulatory Visit (HOSPITAL_COMMUNITY): Payer: Managed Care, Other (non HMO) | Admitting: Anesthesiology

## 2016-06-28 ENCOUNTER — Encounter (HOSPITAL_COMMUNITY)
Admission: RE | Disposition: A | Discharge status: Home / Self Care | Payer: Self-pay | Source: Ambulatory Visit | Attending: Obstetrics & Gynecology

## 2016-06-28 ENCOUNTER — Ambulatory Visit (HOSPITAL_COMMUNITY)
Admission: RE | Admit: 2016-06-28 | Discharge: 2016-06-28 | Disposition: A | Discharge status: Home / Self Care | Payer: Managed Care, Other (non HMO) | Source: Ambulatory Visit | Attending: Obstetrics & Gynecology | Admitting: Obstetrics & Gynecology

## 2016-06-28 DIAGNOSIS — D069 Carcinoma in situ of cervix, unspecified: Secondary | ICD-10-CM | POA: Diagnosis not present

## 2016-06-28 DIAGNOSIS — Z87891 Personal history of nicotine dependence: Secondary | ICD-10-CM | POA: Insufficient documentation

## 2016-06-28 DIAGNOSIS — F329 Major depressive disorder, single episode, unspecified: Secondary | ICD-10-CM | POA: Insufficient documentation

## 2016-06-28 HISTORY — PX: LEEP: SHX91

## 2016-06-28 HISTORY — PX: COLPOSCOPY: SHX161

## 2016-06-28 LAB — PREGNANCY, URINE: Preg Test, Ur: NEGATIVE

## 2016-06-28 LAB — CBC
HEMATOCRIT: 39.4 % (ref 36.0–46.0)
HEMOGLOBIN: 13.4 g/dL (ref 12.0–15.0)
MCH: 30 pg (ref 26.0–34.0)
MCHC: 34 g/dL (ref 30.0–36.0)
MCV: 88.3 fL (ref 78.0–100.0)
Platelets: 227 10*3/uL (ref 150–400)
RBC: 4.46 MIL/uL (ref 3.87–5.11)
RDW: 13.6 % (ref 11.5–15.5)
WBC: 7.1 10*3/uL (ref 4.0–10.5)

## 2016-06-28 SURGERY — COLPOSCOPY
Anesthesia: Monitor Anesthesia Care

## 2016-06-28 MED ORDER — IODINE STRONG (LUGOLS) 5 % PO SOLN
ORAL | Status: AC
  Filled 2016-06-28: qty 1

## 2016-06-28 MED ORDER — MIDAZOLAM HCL 5 MG/5ML IJ SOLN
INTRAMUSCULAR | Status: DC | PRN
  Administered 2016-06-28: 2 mg via INTRAVENOUS

## 2016-06-28 MED ORDER — SCOPOLAMINE 1 MG/3DAYS TD PT72
1.0000 | MEDICATED_PATCH | Freq: Once | TRANSDERMAL | Status: DC
  Administered 2016-06-28: 1.5 mg via TRANSDERMAL

## 2016-06-28 MED ORDER — KETOROLAC TROMETHAMINE 30 MG/ML IJ SOLN
INTRAMUSCULAR | Status: AC
  Filled 2016-06-28: qty 1

## 2016-06-28 MED ORDER — SCOPOLAMINE 1 MG/3DAYS TD PT72
MEDICATED_PATCH | TRANSDERMAL | Status: AC
  Administered 2016-06-28: 1.5 mg via TRANSDERMAL
  Filled 2016-06-28: qty 1

## 2016-06-28 MED ORDER — KETOROLAC TROMETHAMINE 30 MG/ML IJ SOLN: 30.0000 mg | Freq: Four times a day (QID) | INTRAMUSCULAR | Status: DC | PRN

## 2016-06-28 MED ORDER — MORPHINE SULFATE (PF) 4 MG/ML IV SOLN
INTRAVENOUS | Status: AC
  Administered 2016-06-28: 2 mg via INTRAVENOUS
  Filled 2016-06-28: qty 1

## 2016-06-28 MED ORDER — FENTANYL CITRATE (PF) 100 MCG/2ML IJ SOLN: 25.0000 ug | INTRAMUSCULAR | Status: DC | PRN

## 2016-06-28 MED ORDER — ONDANSETRON HCL 4 MG/2ML IJ SOLN: 4.0000 mg | Freq: Four times a day (QID) | INTRAMUSCULAR | Status: DC | PRN

## 2016-06-28 MED ORDER — LIDOCAINE HCL (CARDIAC) 20 MG/ML IV SOLN
INTRAVENOUS | Status: DC | PRN
  Administered 2016-06-28: 100 mg via INTRAVENOUS

## 2016-06-28 MED ORDER — DEXAMETHASONE SODIUM PHOSPHATE 10 MG/ML IJ SOLN
INTRAMUSCULAR | Status: DC | PRN
  Administered 2016-06-28: 10 mg via INTRAVENOUS

## 2016-06-28 MED ORDER — ONDANSETRON HCL 4 MG PO TABS: 4.0000 mg | ORAL_TABLET | Freq: Four times a day (QID) | ORAL | Status: DC | PRN

## 2016-06-28 MED ORDER — MORPHINE SULFATE (PF) 4 MG/ML IV SOLN
2.0000 mg | Freq: Once | INTRAVENOUS | Status: AC
  Administered 2016-06-28: 2 mg via INTRAVENOUS

## 2016-06-28 MED ORDER — ONDANSETRON HCL 4 MG/2ML IJ SOLN
INTRAMUSCULAR | Status: AC
  Filled 2016-06-28: qty 2

## 2016-06-28 MED ORDER — LIDOCAINE-EPINEPHRINE (PF) 1 %-1:200000 IJ SOLN
INTRAMUSCULAR | Status: AC
  Filled 2016-06-28: qty 30

## 2016-06-28 MED ORDER — PROPOFOL 10 MG/ML IV BOLUS
INTRAVENOUS | Status: AC
  Filled 2016-06-28: qty 20

## 2016-06-28 MED ORDER — LACTATED RINGERS IV SOLN: INTRAVENOUS | Status: DC

## 2016-06-28 MED ORDER — IBUPROFEN 200 MG PO TABS: 800.0000 mg | ORAL_TABLET | Freq: Three times a day (TID) | ORAL | 3 refills | Status: DC | PRN

## 2016-06-28 MED ORDER — OXYCODONE-ACETAMINOPHEN 7.5-325 MG PO TABS: 1.0000 | ORAL_TABLET | ORAL | 0 refills | Status: DC | PRN

## 2016-06-28 MED ORDER — ACETIC ACID 5 % SOLN
Status: AC
  Filled 2016-06-28: qty 500

## 2016-06-28 MED ORDER — FERRIC SUBSULFATE 259 MG/GM EX SOLN
CUTANEOUS | Status: AC
  Filled 2016-06-28: qty 8

## 2016-06-28 MED ORDER — MIDAZOLAM HCL 2 MG/2ML IJ SOLN
INTRAMUSCULAR | Status: AC
  Filled 2016-06-28: qty 2

## 2016-06-28 MED ORDER — LIDOCAINE HCL 1 % IJ SOLN
INTRAMUSCULAR | Status: AC
  Filled 2016-06-28: qty 20

## 2016-06-28 MED ORDER — LIDOCAINE HCL (CARDIAC) 20 MG/ML IV SOLN
INTRAVENOUS | Status: AC
  Filled 2016-06-28: qty 5

## 2016-06-28 MED ORDER — PROPOFOL 10 MG/ML IV BOLUS
INTRAVENOUS | Status: AC
  Filled 2016-06-28: qty 40

## 2016-06-28 MED ORDER — KETOROLAC TROMETHAMINE 60 MG/2ML IM SOLN: 30.0000 mg | Freq: Once | INTRAMUSCULAR | Status: DC

## 2016-06-28 MED ORDER — ONDANSETRON HCL 4 MG/2ML IJ SOLN
INTRAMUSCULAR | Status: DC | PRN
  Administered 2016-06-28: 4 mg via INTRAVENOUS

## 2016-06-28 MED ORDER — MORPHINE SULFATE (PF) 4 MG/ML IV SOLN: 1.0000 mg | INTRAVENOUS | Status: DC | PRN

## 2016-06-28 MED ORDER — LIDOCAINE-EPINEPHRINE 1 %-1:100000 IJ SOLN
INTRAMUSCULAR | Status: AC
  Filled 2016-06-28: qty 1

## 2016-06-28 MED ORDER — KETOROLAC TROMETHAMINE 30 MG/ML IJ SOLN
INTRAMUSCULAR | Status: DC | PRN
  Administered 2016-06-28: 30 mg via INTRAVENOUS

## 2016-06-28 MED ORDER — PROPOFOL 10 MG/ML IV BOLUS
INTRAVENOUS | Status: DC | PRN
  Administered 2016-06-28 (×2): 20 mg via INTRAVENOUS
  Administered 2016-06-28: 30 mg via INTRAVENOUS
  Administered 2016-06-28 (×10): 20 mg via INTRAVENOUS
  Administered 2016-06-28: 30 mg via INTRAVENOUS
  Administered 2016-06-28: 20 mg via INTRAVENOUS
  Administered 2016-06-28: 30 mg via INTRAVENOUS
  Administered 2016-06-28: 40 mg via INTRAVENOUS
  Administered 2016-06-28: 30 mg via INTRAVENOUS

## 2016-06-28 MED ORDER — FENTANYL CITRATE (PF) 100 MCG/2ML IJ SOLN
INTRAMUSCULAR | Status: AC
  Filled 2016-06-28: qty 2

## 2016-06-28 MED ORDER — DEXAMETHASONE SODIUM PHOSPHATE 10 MG/ML IJ SOLN
INTRAMUSCULAR | Status: AC
  Filled 2016-06-28: qty 1

## 2016-06-28 MED ORDER — ACETAMINOPHEN 325 MG PO TABS
650.0000 mg | ORAL_TABLET | ORAL | Status: DC | PRN
Start: 2016-06-28 — End: 2016-06-28

## 2016-06-28 MED ORDER — PROMETHAZINE HCL 25 MG/ML IJ SOLN: 6.2500 mg | INTRAMUSCULAR | Status: DC | PRN

## 2016-06-28 MED ORDER — OXYCODONE-ACETAMINOPHEN 5-325 MG PO TABS: 1.0000 | ORAL_TABLET | ORAL | Status: DC | PRN

## 2016-06-28 MED ORDER — LACTATED RINGERS IV SOLN
INTRAVENOUS | Status: DC
  Administered 2016-06-28: 11:00:00 via INTRAVENOUS

## 2016-06-28 MED ORDER — KETOROLAC TROMETHAMINE 30 MG/ML IJ SOLN
INTRAMUSCULAR | Status: AC
  Administered 2016-06-28: 30 mg via INTRAMUSCULAR
  Filled 2016-06-28: qty 1

## 2016-06-28 SURGICAL SUPPLY — 36 items
APPLICATOR COTTON TIP 6IN STRL (MISCELLANEOUS) ×3 IMPLANT
BLADE SURG 15 STRL LF C SS BP (BLADE) IMPLANT
BLADE SURG 15 STRL SS (BLADE)
CLOTH BEACON ORANGE TIMEOUT ST (SAFETY) ×3 IMPLANT
CONTAINER PREFILL 10% NBF 15ML (MISCELLANEOUS) ×1 IMPLANT
COUNTER NEEDLE 1200 MAGNETIC (NEEDLE) ×2 IMPLANT
ELECT LOOP LEEP RND 15X12 GRN (CUTTING LOOP) ×3
ELECT LOOP LEEP RND 20X12 WHT (CUTTING LOOP)
ELECT LOOP LEEP SQR 10X10 ORG (CUTTING LOOP) ×3
ELECT REM PT RETURN 9FT ADLT (ELECTROSURGICAL) ×3
ELECTRODE LOOP LP RND 15X12GRN (CUTTING LOOP) IMPLANT
ELECTRODE LOOP LP RND 20X12WHT (CUTTING LOOP) IMPLANT
ELECTRODE LOOP LP SQR 10X10ORG (CUTTING LOOP) IMPLANT
ELECTRODE REM PT RTRN 9FT ADLT (ELECTROSURGICAL) ×1 IMPLANT
EXTENDER ELECT LOOP LEEP 10CM (CUTTING LOOP) IMPLANT
GLOVE BIO SURGEON STRL SZ 6.5 (GLOVE) ×2 IMPLANT
GLOVE BIO SURGEON STRL SZ7 (GLOVE) ×3 IMPLANT
GLOVE BIO SURGEONS STRL SZ 6.5 (GLOVE) ×1
GLOVE BIOGEL PI IND STRL 7.0 (GLOVE) ×2 IMPLANT
GLOVE BIOGEL PI INDICATOR 7.0 (GLOVE) ×2
GOWN STRL REUS W/TWL LRG LVL3 (GOWN DISPOSABLE) ×8 IMPLANT
HOSE NS SMOKE EVAC 7/8 X6 (MISCELLANEOUS) ×2 IMPLANT
HOSE NS SMOKE EVAC 7/8 X6' (MISCELLANEOUS) ×1
NS IRRIG 1000ML POUR BTL (IV SOLUTION) ×3 IMPLANT
PACK VAGINAL MINOR WOMEN LF (CUSTOM PROCEDURE TRAY) ×3 IMPLANT
PAD OB MATERNITY 4.3X12.25 (PERSONAL CARE ITEMS) ×3 IMPLANT
PAD PREP 24X48 CUFFED NSTRL (MISCELLANEOUS) ×3 IMPLANT
PENCIL BUTTON HOLSTER BLD 10FT (ELECTRODE) ×3 IMPLANT
REDUCER FITTING SMOKE EVAC (MISCELLANEOUS) ×3 IMPLANT
SCOPETTES 8  STERILE (MISCELLANEOUS) ×2
SCOPETTES 8 STERILE (MISCELLANEOUS) ×2 IMPLANT
SUT VIC AB 4-0 SH 27 (SUTURE)
SUT VIC AB 4-0 SH 27XANBCTRL (SUTURE) IMPLANT
TOWEL OR 17X24 6PK STRL BLUE (TOWEL DISPOSABLE) ×6 IMPLANT
TUBING SMOKE EVAC HOSE ADAPTER (MISCELLANEOUS) ×3 IMPLANT
WATER STERILE IRR 1000ML POUR (IV SOLUTION) ×1 IMPLANT

## 2016-06-28 NOTE — Op Note (Signed)
Procedure(s): COLPOSCOPY LOOP ELECTROSURGICAL EXCISION PROCEDURE (LEEP) Procedure Note  Jasmine Hernandez female 30 y.o.  Procedure(s) and Anesthesia Type:    * COLPOSCOPY - Choice    * LOOP ELECTROSURGICAL EXCISION PROCEDURE (LEEP) - Choice  Surgeon(s) and Role:    * Essie Hart, MD - Primary  DATE OF PROCEDURE: 06/28/2016  PREOPERATIVE DIAGNOSES: 1.  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  High-grade squamous intraepithelial lesion of the cervix.   PROCEDURES PERFORMED:  Loop electrosurgical excision procedure, cervical cone biopsy.  DESCRIPTION OF PROCEDURE:  The patient was taken to the outpatient department where in the low dorsal lithotomy position, her cervix was identified with a medium Graves speculum. Cervix was then swabbed with acetic acid, and colposcopy was performed, the transition zone was adequately visualized and there was acetowhite lesions at the level of the transition zone. The cervix was then covered with Lugol's solution and an ECC was performed.  A top hat electrocautery was performed to obtain the endocervical cells.   A LEEP cervical cone biopsy was performed in the usual fashion using a medium loop.  The tissue was sent to pathology for histopathologic evaluation (tagged with silk at 12 o'clock). Areas of bleeders were hemostatic with ball cautery and Monsel's . After hemostasis was confirmed, the instruments were removed from the vaginal vault. The patient was then placed in the supine position.  The patient tolerated the procedure well. There were no intraoperative complications. Estimated blood loss was less than 5 mL. The patient will return in two weeks for routine postoperative exam.  Jasmine Hernandez STACIA

## 2016-06-28 NOTE — OR Nursing (Signed)
Paracervical Block placed at bedside in PACU by Dr. Mora Appl.  1% Lidocaine 55mL injected.

## 2016-06-28 NOTE — H&P (Signed)
Jasmine Hernandez is an 30 y.o. female P3 with high grade endocervical cells on ECC from colposcopy ( HSIL on pap smear) presents for LEEP procedure  Pertinent Gynecological History: Menses: flow is light Bleeding: none Contraception: IUD DES exposure: denies Blood transfusions: none Sexually transmitted diseases: no past history Previous GYN Procedures: colpo  Last mammogram: none Last pap: abnormal: 05/2016  OB History: G3, P3   Menstrual History: Menarche age: 6 Patient's last menstrual period was 05/24/2016 (approximate).    Past Medical History:  Diagnosis Date  . Depression   . ETOH abuse     Past Surgical History:  Procedure Laterality Date  . CESAREAN SECTION      Family History  Problem Relation Age of Onset  . Alcohol abuse Father   . Drug abuse Father   . Cancer Maternal Grandmother   . Cancer Maternal Grandfather   . Hypertension Paternal Grandmother     Social History:  reports that she quit smoking about 4 years ago. Her smoking use included Cigarettes. She has a 2.50 pack-year smoking history. She has never used smokeless tobacco. She reports that she drinks about 3.0 oz of alcohol per week . She reports that she uses drugs, including Marijuana.  Allergies: No Known Allergies  Prescriptions Prior to Admission  Medication Sig Dispense Refill Last Dose  . calcium carbonate (TUMS - DOSED IN MG ELEMENTAL CALCIUM) 500 MG chewable tablet Chew 4-5 tablets by mouth daily as needed for indigestion or heartburn.   Past Week at Unknown time  . ibuprofen (ADVIL,MOTRIN) 200 MG tablet Take 800 mg by mouth every 6 (six) hours as needed for moderate pain.   More than a month at Unknown time    ROS  Blood pressure 105/68, pulse 85, temperature 98.1 F (36.7 C), temperature source Oral, resp. rate 16, height 5\' 1"  (1.549 m), weight 73.9 kg (163 lb), last menstrual period 05/24/2016, SpO2 98 %. Physical Exam  Vitals reviewed. Constitutional: She is oriented to person,  place, and time. She appears well-developed and well-nourished.  HENT:  Head: Normocephalic and atraumatic.  Cardiovascular: Normal rate and regular rhythm.   Respiratory: Effort normal.  GI: Soft. There is no tenderness.  Genitourinary: Vagina normal and uterus normal.  Musculoskeletal: Normal range of motion.  Neurological: She is alert and oriented to person, place, and time.  Skin: Skin is warm.    Results for orders placed or performed during the hospital encounter of 06/28/16 (from the past 24 hour(s))  Pregnancy, urine     Status: None   Collection Time: 06/28/16 10:49 AM  Result Value Ref Range   Preg Test, Ur NEGATIVE NEGATIVE  CBC     Status: None   Collection Time: 06/28/16 10:55 AM  Result Value Ref Range   WBC 7.1 4.0 - 10.5 K/uL   RBC 4.46 3.87 - 5.11 MIL/uL   Hemoglobin 13.4 12.0 - 15.0 g/dL   HCT 11.9 14.7 - 82.9 %   MCV 88.3 78.0 - 100.0 fL   MCH 30.0 26.0 - 34.0 pg   MCHC 34.0 30.0 - 36.0 g/dL   RDW 56.2 13.0 - 86.5 %   Platelets 227 150 - 400 K/uL    No results found.  Assessment/Plan: 30 yo with High grade cells on ECC on colpo. Patient on call to OR for LEEP with Top Hat.  Risks of procedure previously explained to patient, she verbalized understanding and Agrees to proceed  Essie Hart STACIA 06/28/2016, 11:25 AM

## 2016-06-28 NOTE — Discharge Instructions (Signed)

## 2016-06-28 NOTE — Anesthesia Postprocedure Evaluation (Signed)
Anesthesia Post Note  Patient: Jasmine Hernandez  Procedure(s) Performed: Procedure(s) (LRB): COLPOSCOPY (N/A) LOOP ELECTROSURGICAL EXCISION PROCEDURE (LEEP) (N/A)  Patient location during evaluation: PACU Anesthesia Type: General Level of consciousness: awake Pain management: pain level controlled Vital Signs Assessment: post-procedure vital signs reviewed and stable Respiratory status: spontaneous breathing Cardiovascular status: stable Postop Assessment: no signs of nausea or vomiting Anesthetic complications: no     Last Vitals:  Vitals:   06/28/16 1315 06/28/16 1330  BP: (!) 128/103   Pulse: (!) 121   Resp: 18 16  Temp:      Last Pain:  Vitals:   06/28/16 1330  TempSrc:   PainSc: Asleep   Pain Goal: Patients Stated Pain Goal: 5 (Pt has h/o substance abuse and wants no narcotics) (06/28/16 1102)               Jazen Spraggins JR,JOHN Vernard Gram

## 2016-06-28 NOTE — Anesthesia Preprocedure Evaluation (Addendum)
Anesthesia Evaluation  Patient identified by MRN, date of birth, ID band Patient awake    Reviewed: Allergy & Precautions, NPO status , Patient's Chart, lab work & pertinent test results  History of Anesthesia Complications Negative for: history of anesthetic complications  Airway Mallampati: II  TM Distance: >3 FB Neck ROM: Full    Dental  (+) Teeth Intact, Dental Advisory Given   Pulmonary neg pulmonary ROS, former smoker,    Pulmonary exam normal breath sounds clear to auscultation       Cardiovascular Exercise Tolerance: Good negative cardio ROS Normal cardiovascular exam Rhythm:Regular Rate:Normal     Neuro/Psych PSYCHIATRIC DISORDERS Depression negative neurological ROS     GI/Hepatic negative GI ROS, (+)     substance abuse  alcohol use,   Endo/Other  negative endocrine ROS  Renal/GU negative Renal ROS     Musculoskeletal negative musculoskeletal ROS (+)   Abdominal   Peds  Hematology negative hematology ROS (+)   Anesthesia Other Findings Day of surgery medications reviewed with the patient.  Reproductive/Obstetrics dysplasia                             Anesthesia Physical Anesthesia Plan  ASA: II  Anesthesia Plan: MAC   Post-op Pain Management:    Induction: Intravenous  Airway Management Planned: Simple Face Mask  Additional Equipment:   Intra-op Plan:   Post-operative Plan:   Informed Consent: I have reviewed the patients History and Physical, chart, labs and discussed the procedure including the risks, benefits and alternatives for the proposed anesthesia with the patient or authorized representative who has indicated his/her understanding and acceptance.   Dental advisory given  Plan Discussed with: CRNA  Anesthesia Plan Comments: (Discussed risks/benefits/alternatives to MAC sedation including need for ventilatory support, hypotension, need for  conversion to general anesthesia.  All patient questions answered.  Patient/guardian wishes to proceed.)       Anesthesia Quick Evaluation

## 2016-06-28 NOTE — Transfer of Care (Signed)
Immediate Anesthesia Transfer of Care Note  Patient: Jasmine Hernandez  Procedure(s) Performed: Procedure(s): COLPOSCOPY (N/A) LOOP ELECTROSURGICAL EXCISION PROCEDURE (LEEP) (N/A)  Patient Location: PACU  Anesthesia Type:MAC  Level of Consciousness:  sedated, patient cooperative and responds to stimulation  Airway & Oxygen Therapy:Patient Spontanous Breathing and Patient connected to face mask oxgen  Post-op Assessment:  Report given to PACU RN and Post -op Vital signs reviewed and stable  Post vital signs:  Reviewed and stable  Last Vitals:  Vitals:   06/28/16 1102 06/28/16 1300  BP: 105/68   Pulse: 85   Resp: 16   Temp: 36.7 C 36.6 C    Complications: No apparent anesthesia complications

## 2016-06-29 ENCOUNTER — Encounter (HOSPITAL_COMMUNITY): Payer: Self-pay

## 2016-07-04 ENCOUNTER — Encounter (HOSPITAL_COMMUNITY): Payer: Self-pay | Admitting: Obstetrics & Gynecology

## 2016-07-06 ENCOUNTER — Encounter: Payer: Self-pay | Admitting: Gynecologic Oncology

## 2016-07-06 ENCOUNTER — Ambulatory Visit: Payer: Managed Care, Other (non HMO) | Attending: Gynecologic Oncology | Admitting: Gynecologic Oncology

## 2016-07-06 DIAGNOSIS — Z9889 Other specified postprocedural states: Secondary | ICD-10-CM | POA: Insufficient documentation

## 2016-07-06 DIAGNOSIS — F329 Major depressive disorder, single episode, unspecified: Secondary | ICD-10-CM | POA: Diagnosis not present

## 2016-07-06 DIAGNOSIS — Z87891 Personal history of nicotine dependence: Secondary | ICD-10-CM | POA: Diagnosis not present

## 2016-07-06 DIAGNOSIS — D069 Carcinoma in situ of cervix, unspecified: Secondary | ICD-10-CM | POA: Insufficient documentation

## 2016-07-06 DIAGNOSIS — Z79899 Other long term (current) drug therapy: Secondary | ICD-10-CM | POA: Diagnosis not present

## 2016-07-06 NOTE — Progress Notes (Signed)
GYNECOLOGIC ONCOLOGY NEW PATIENT CONSULTATION  Date of Service: 07/06/16, 11:15 Referring Provider: Delila Spence. Mora Appl, MD Consulting Provider: Junie Spencer. Chestine Spore, MD  HISTORY OF PRESENT ILLNESS: Jasmine Hernandez is a 30 y.o. 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  Jasmine Hernandez presents today with her husband and mother in law. She has a history of ASC-H pap smear which prompted a colposcopic evaluation and biopsies. These biopsies were normal but ECC showed HSIL. Therefore an excisional procedure and ECC were performed on 7/25. 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 She is recovering well from surgery. She has not yet resumed sexual intercourse. She is still having some discomfort. She has had some spotting but no heavy bleeding. She feels this may represent her menstrual cycle.  Jasmine Hernandez and her husband have three children. They report they are finished with childbearing and she does not desire fertility. She is currently using a Mirena IUD for contraception. She has no significant family or medical history.  PAST MEDICAL HISTORY: Past Medical History:  Diagnosis Date  . Depression   . ETOH abuse     PAST SURGICAL HISTORY: Past Surgical History:  Procedure Laterality Date  . CESAREAN SECTION    . CESAREAN SECTION    . COLPOSCOPY N/A 06/28/2016   Procedure: COLPOSCOPY;  Surgeon: Essie Hart, MD;  Location: [facility];  Service: Gynecology;  Laterality: N/A;  . LEEP N/A 06/28/2016   Procedure: LOOP ELECTROSURGICAL EXCISION PROCEDURE (LEEP);  Surgeon: Essie Hart, MD;  Location: [facility];  Service: Gynecology;  Laterality: N/A;    OB/GYN HISTORY:  G3P3, Menarche 11, Denies history of dysplasia prior to this   MEDICATIONS:  Current Outpatient Prescriptions:  .  calcium carbonate (TUMS - DOSED IN MG ELEMENTAL CALCIUM) 500 MG chewable tablet, Chew 4-5 tablets by mouth daily as needed for indigestion or heartburn., Disp: , Rfl:  .  ibuprofen (ADVIL,MOTRIN) 200 MG  tablet, Take 4 tablets (800 mg total) by mouth every 8 (eight) hours as needed for moderate pain., Disp: 30 tablet, Rfl: 3  ALLERGIES: No Known Allergies  FAMILY HISTORY: Family History  Problem Relation Age of Onset  . Alcohol abuse Father   . Drug abuse Father   . Cancer Maternal Grandmother   . Cancer Maternal Grandfather   . Hypertension Paternal Grandmother     SOCIAL HISTORY: Social History   Social History  . Marital status: Married    Spouse name: N/A  . Number of children: N/A  . Years of education: N/A   Occupational History  . Not on file.   Social History Main Topics  . Smoking status: Former Smoker    Packs/day: 0.25    Years: 10.00    Types: Cigarettes    Quit date: 12/26/2013  . Smokeless tobacco: Never Used  . Alcohol use No  . Drug use: No  . Sexual activity: Not Currently    Birth control/ protection: Abstinence   Other Topics Concern  . Not on file   Social History Narrative   ** Merged History Encounter **        REVIEW OF SYSTEMS: New patient intake form was reviewed.  Complete 10-system review is negative except for the following: vaginal bleeding.  PHYSICAL EXAM: BP 116/69 (BP Location: Left Arm, Patient Position: Sitting)   Pulse 80   Temp 98.2 F (36.8 C) (Oral)   Resp 18   Ht  (1.549 m)   Wt 164 lb  4.8 oz (74.5 kg)   LMP 05/24/2016 (Approximate)   SpO2 100%   BMI 31.04 kg/m  General: Alert, oriented, no acute distress. HEENT: Normocephalic, atraumatic.  Sclera anicteric, posterior oropharynx clear.  Normal dentition. Chest: Clear to auscultation bilaterally. Cardiovascular: Regular rate and rhythm, no murmurs, rubs, or gallops. Abdomen: Normoactive bowel sounds.  Soft, nondistended, nontender to palpation.  No masses or hepatosplenomegaly appreciated.  No evidence of hernia. Extremities: Grossly normal range of motion.  Warm, well perfused.  No edema bilaterally. Skin: No rashes or lesions. Lymphatics: No cervical,  supraclavicular, or inguinal adenopathy. GU: External genitalia without lesions.  On speculum exam, healing cervix without gross lesion.  Bimanual exam reveals mobile cervix without lesion, small anteverted uterus with no adnexal mass.   LABORATORY AND RADIOLOGIC DATA: Outside medical records were reviewed to synthesize the above history, along with the history and physical obtained during the visit.  Outside laboratory, pathology, and imaging reports were reviewed, with pertinent results below.    LEEP Path: Diagnosis 1. Endocervix, curettage - DETACHED DYSPLASTIC SQUAMOUS MUCOSAL FRAGMENTS SHOWING HIGH GRADE SQUAMOUS INTRAEPITHELIAL LESION, CIN-III (SEVERE DYSPLASIA/CIS). - HIGH GRADE SQUAMOUS INTRAEPITHELIAL LESION, CIN-III (SEVERE DYSPLASIA/CIS) ALSO INVOLVES A FRAGMENT OF TRANSFORMATION ZONE MUCOSA WITH INVOLVEMENT OF UNDERLYING ENDOCERVICAL GLANDS. - ADDITIONAL DETACHED FRAGMENTS OF BENIGN ENDOCERVICAL MUCOSA. 2. Cervix, LEEP, suture tag at 12:00 - BENIGN TRANSFORMATION ZONE MUCOSA. - NO DYSPLASIA, ATYPIA OR MALIGNANCY IDENTIFIED. 3. Cervix, LEEP, suture tag at 6:00 - TRANSFORMATION ZONE MUCOSA SHOWING HIGH GRADE SQUAMOUS INTRAEPITHELIAL LESION, CIN-III (SEVERE DYSPLASIA/CIS). - BACKGROUND LOW GRADE SQUAMOUS INTRAEPITHELIAL LESION, CIN-I (MILD DYSPLASIA). - HIGH GRADE SQUMAOUS INTRAEPITHELIAL LESION (HIGH GRADE SQUAMOUS DYSPLASIA) INVOLVES ENDOCERVICAL MARGIN. - ECTOCERVICAL MARGIN IS FREE OF DYSPLASIA.  ASSESSMENT AND PLAN: Jasmine Hernandez is a 30 y.o. 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  Today we reviewed at length the diagnosis of CIN3 and the natural history of cervical cancer. We also reviewed possible treatment options for management of "positive margin", specifically the endocervical location including repeat excision versus pap smear/HPV cotesting with ECC in 6 months. Discussed pros and cons of additonal surgical therapy and potential for more surgery in the future  if repeat pap is chosen. At the conclusion of our discussion today, the patient and her husband desire to proceed with pap with HPV co-testing AND ECC/cytobrush in 6 months for re-assessment. We reviewed she will need 6 month testing x2 and then can move to annual testing.We discussed a potential need for additional biopsies or excision procedure in the future.  The patient prefers to follow up with Dr. Mora Appl for this assessment which I feel is very reasonable. I am happy to see her back at any point.   A copy of this note was sent to the patient's referring provider. I appreciate the opportunity to be involved in the care of this patient.  Junie Spencer. Chestine Spore, MD

## 2016-07-06 NOTE — Patient Instructions (Signed)
Plan to have a repeat pap smear in six months time.  You can have that with Dr. Mora Appl or with Dr. Chestine Spore in GYN Oncology.  If that is abnormal, we will make an appointment to discuss treatment options.  Please call for any questions or concerns.

## 2016-11-21 ENCOUNTER — Other Ambulatory Visit: Payer: Self-pay | Admitting: Obstetrics & Gynecology

## 2016-11-22 LAB — CYTOLOGY - PAP

## 2016-12-09 ENCOUNTER — Encounter: Payer: Self-pay | Admitting: Gynecology

## 2016-12-09 ENCOUNTER — Ambulatory Visit: Payer: 59 | Attending: Gynecology | Admitting: Gynecology

## 2016-12-09 VITALS — BP 129/75 | HR 99 | Temp 98.8°F | Resp 17 | Ht 61.25 in | Wt 169.4 lb

## 2016-12-09 DIAGNOSIS — Z809 Family history of malignant neoplasm, unspecified: Secondary | ICD-10-CM | POA: Insufficient documentation

## 2016-12-09 DIAGNOSIS — Z811 Family history of alcohol abuse and dependence: Secondary | ICD-10-CM | POA: Diagnosis not present

## 2016-12-09 DIAGNOSIS — Z9889 Other specified postprocedural states: Secondary | ICD-10-CM | POA: Insufficient documentation

## 2016-12-09 DIAGNOSIS — N871 Moderate cervical dysplasia: Secondary | ICD-10-CM | POA: Insufficient documentation

## 2016-12-09 DIAGNOSIS — D069 Carcinoma in situ of cervix, unspecified: Secondary | ICD-10-CM

## 2016-12-09 DIAGNOSIS — F329 Major depressive disorder, single episode, unspecified: Secondary | ICD-10-CM | POA: Diagnosis not present

## 2016-12-09 DIAGNOSIS — Z975 Presence of (intrauterine) contraceptive device: Secondary | ICD-10-CM | POA: Insufficient documentation

## 2016-12-09 DIAGNOSIS — Z87891 Personal history of nicotine dependence: Secondary | ICD-10-CM | POA: Insufficient documentation

## 2016-12-09 DIAGNOSIS — Z79899 Other long term (current) drug therapy: Secondary | ICD-10-CM | POA: Diagnosis not present

## 2016-12-09 DIAGNOSIS — Z8249 Family history of ischemic heart disease and other diseases of the circulatory system: Secondary | ICD-10-CM | POA: Diagnosis not present

## 2016-12-09 NOTE — Patient Instructions (Signed)
Please talk with your HR department and give [place] a call when you have decided on a date for your conization of the cervix.  Potential dates include: Jan 16, 18, 23, 25, or 30.  Conization of the Cervix Cervical conization is the cutting (excision) of a cone-shaped portion of the cervix. The procedure is performed through the vagina in either your health care provider's office or an operating room. This procedure is usually done when there is abnormal bleeding from the cervix. It can also be done to evaluate an abnormal Pap test or if an abnormality is seen on the cervix during an exam. The tissue is then examined to see if there are precancerous cells or cancer present.  Conization of the cervix is not done during a menstrual period or pregnancy.  LET [facility] CARE PROVIDER KNOW ABOUT:  Any allergies you have.   All medicines you are taking, including vitamins, herbs, eye drops, creams, and over-the-counter medicines.   Previous problems you or members of your family have had with the use of anesthetics.   Any blood disorders you have.   Previous surgeries you have had.   Medical conditions you have.   Your smoking habits.   The possibility of being pregnant.  RISKS AND COMPLICATIONS  Generally, conization of the cervix is a safe procedure. However, as with any procedure, complications can occur. Possible complications include:  Heavy bleeding several days or weeks after the procedure. Light bleeding or spotting after the procedure is normal.  Infection (rare).  Damage to the cervix or surrounding organs (uncommon).   Problems with the anesthesia.   Increased risk of preterm labor in future pregnancies. BEFORE THE PROCEDURE  Do not eat or drink anything for 6-8 hours before the procedure.   Do not take aspirin or blood thinners for at least a week before the procedure or as directed by your health care provider.   Arrange for someone to take you home after the  procedure.  PROCEDURE There are three different methods to perform conization of the cervix. These include:   The cold knife method. In this method a small cone-shaped sample of tissue is cut out with a knife (scalpel) from the cervical canal and the transformation zone (where the normal cells end and the abnormal cells begin).   The LEEP method. In this method a small cone-shaped sample of tissue is cut out with a thin wire that can burn (cauterize) the cervical tissue with an electrical current.   Laser treatment. In this method a small cone-shaped sample of tissue is cut out and then cauterized with a laser beam to prevent bleeding.  The procedure will be performed as follows:   Depending on the method, you will either be given a medicine to make you sleep (general anesthetic) or a numbing medicine (local anesthetic). A medicine that numbs the cervix (cervical block) may be given.   A lubricated device called a speculum will be inserted into the vagina to spread open the walls of the vagina. This will help your health care provider see the inside of the vagina and cervix better.   The tissue from the cervix will be removed and examined.   The results of the procedure will help your health care provider decide if further treatment is necessary. They will also help your health care provider decide on the best treatment if your results are abnormal. AFTER THE PROCEDURE  If you had a general anesthetic, you may be groggy for 2-3  hours after the procedure.   If you had a local anesthetic, you will rest at the clinic or hospital until you are stable and feel ready to go home.   Recovery may take up to 3 weeks.   You may have some cramping for about 1 week.   You may have bloody discharge or light bleeding for 1-2 weeks.   You may have black discharge coming from the vagina. This is from the paste used on the cervix to prevent bleeding. This is normal discharge.  This  information is not intended to replace advice given to you by your health care provider. Make sure you discuss any questions you have with your health care provider. Document Released: 08/31/2005 Document Revised: 11/26/2013 Document Reviewed: 05/17/2013 Elsevier Interactive Patient Education  2017 [employer]

## 2016-12-09 NOTE — Progress Notes (Signed)
Consult Note: Gyn-Onc   Jasmine Hernandez 31 y.o. female  Chief Complaint  Patient presents with  . 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  Plan: I reviewed the current Pap smear findings with the patient and her husband as well as her past history of a LEEP procedure with a positive margin. At this juncture with persistent abnormal Pap smear I would recommend the patient undergo a cold knife conization. Surgical procedure was reviewed with the patient her husband along with risks. The potential for CIN-3 to progressing to invasive cervical cancer was also reviewed. The patient will contact us to schedule surgery in the near future. It's recall that she has a Mirena IUD in place and hopefully we will be able to perform the conization and still leave the IUD in place.  Interval History: Patient returns today having undergone a repeat Pap smear on 11/21/2016. On that Pap smear high-grade squamous intraepithelial lesion is noted along with atypical glands which may indicate possible gland involvement. The patient remains entirely asymptomatic.  ZOX:WRUEAHPI:Jasmine Hernandez is a 31 y.o. woman who is seen in consultation at the request of Dr. Mora ApplPinn for evaluation of CIN3.  She has a history of ASC-H pap smear which prompted a colposcopic evaluation and biopsies. These biopsies were normal but ECC showed HSIL. Therefore an excisional procedure (LEEP) and ECC were performed on 7/25. 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   At the initial consultation with gynecologic oncology the pros and cons of repeat excision versus observation were evaluated and discussed. She decided to follow the patient with repeat Pap smear. Repeat Pap smear on 11/21/2016 showed high-grade squamous intraepithelial lesion with atypical glands.   Review of Systems:10 point review of systems is negative except as noted in interval history.   Vitals:  Blood pressure 129/75, pulse 99, temperature 98.8 F (37.1 C), temperature source Oral, resp. rate 17, height 5' 1.25" (1.556 m), weight 169 lb 6.4 oz (76.8 kg), SpO2 100 %.  Physical Exam: General : The patient is a healthy woman in no acute distress.  HEENT: normocephalic, extraoccular movements normal; neck is supple without thyromegally  Lynphnodes: Supraclavicular and inguinal nodes not enlarged  Abdomen: Soft, non-tender, no ascites, no organomegally, no masses, no hernias     Lower extremities: No edema or varicosities. Normal range of motion      No Known Allergies  Past Medical History:  Diagnosis Date  . Depression   . ETOH abuse     Past Surgical History:  Procedure Laterality Date  . CESAREAN SECTION    . CESAREAN SECTION    . COLPOSCOPY N/A 06/28/2016   Procedure: COLPOSCOPY;  Surgeon: Essie HartWalda Pinn, MD;  Location: [facility];  Service: Gynecology;  Laterality: N/A;  . LEEP N/A 06/28/2016   Procedure: LOOP ELECTROSURGICAL EXCISION PROCEDURE (LEEP);  Surgeon: Essie HartWalda Pinn, MD;  Location: [facility];  Service: Gynecology;  Laterality: N/A;    Current Outpatient Prescriptions  Medication Sig Dispense Refill  . calcium carbonate (TUMS - DOSED IN MG ELEMENTAL CALCIUM) 500 MG chewable tablet Chew 4-5 tablets by mouth daily as needed for indigestion or heartburn.    Marland Kitchen. ibuprofen (ADVIL,MOTRIN) 200 MG tablet Take 4 tablets (800 mg total) by mouth every 8 (eight) hours as needed for moderate pain. 30 tablet 3  . levonorgestrel (MIRENA) 20 MCG/24HR IUD by Intrauterine route.     No current facility-administered medications for this visit.  Social History   Social History  . Marital status: Married    Spouse name: N/A  . Number of children: N/A  . Years of education: N/A   Occupational History  . Not on file.   Social History Main Topics  . Smoking status: Former Smoker    Packs/day: 0.25    Years: 10.00    Types: Cigarettes    Quit date: 12/26/2013  . Smokeless tobacco:  Never Used  . Alcohol use No  . Drug use: No  . Sexual activity: Not Currently    Birth control/ protection: Abstinence   Other Topics Concern  . Not on file   Social History Narrative   ** Merged History Encounter **        Family History  Problem Relation Age of Onset  . Alcohol abuse Father   . Drug abuse Father   . Cancer Maternal Grandmother   . Cancer Maternal Grandfather   . Hypertension Paternal Grandmother       De Blanch, MD 12/09/2016, 12:54 PM

## 2016-12-12 ENCOUNTER — Telehealth: Payer: Self-pay | Admitting: Gynecologic Oncology

## 2016-12-12 NOTE — Telephone Encounter (Signed)
Patient called back and left message that she would like to pick her FMLA papers up.

## 2016-12-12 NOTE — Telephone Encounter (Signed)
Patient called and states she would like to have her CKC on Feb 22 and would like to be out of work for 3 weeks.  She is going to fax her paperwork to us and is requesting it be faxed back to [phone].

## 2016-12-13 ENCOUNTER — Telehealth: Payer: Self-pay | Admitting: Gynecologic Oncology

## 2016-12-13 NOTE — Telephone Encounter (Signed)
Patient called to confirm that we received her FMLA forms.  Advised her that we did receive them and they are complete for her to pick up.  She stated she would prefer them to the faxed at this time because she will be able to receive them off the fax machine.  FMLA forms faxed.  Patient advised to call for any needs or concerns.

## 2016-12-16 ENCOUNTER — Telehealth: Payer: Self-pay | Admitting: Gynecologic Oncology

## 2016-12-16 NOTE — Telephone Encounter (Signed)
Patient called stating she has been started on a new weight loss medication, Contrave.  Contrave is a combination of Wellbutrin and Narcan.  She was asking about instructions around her upcoming surgery on 01/26/17.  [facility]  Per Angelique Blonderenise, she needs to stop the medication at least 2 weeks before surgery.    Returned call to the patient to give her this information.  Left message with the above information and advised her to please call the office for any questions or concerns.

## 2017-01-04 ENCOUNTER — Telehealth: Payer: Self-pay | Admitting: Gynecologic Oncology

## 2017-01-04 NOTE — Telephone Encounter (Signed)
Returned call to patient.  She had left a message earlier this am about a short term disability form that needed to be filled out and she stated she is taking a new medication she wanted to let us know about.  Left message for her asking her to please call the office to discuss.

## 2017-01-04 NOTE — Telephone Encounter (Signed)
Patient returned call to the office.  She states she will fax or drop off the forms when she receives the correct forms from her HR department.  She states she switched bariatric clinics and is now taking phenermine 15 mg once daily.  Advised her that the same instructions apply to stop the medication 2 weeks before her scheduled surgery for Feb 22.  She states she has stopped the [employer] diet medication.  I advised her to contact the bariatric clinic and see if they wanted her to taper off the medication or to stop it all at once but that she would need to stop as of Feb 8.  Advised to contact them now in case a taper was necessary.  All questions answered.  Advised to call for any needs or concerns.

## 2017-01-19 ENCOUNTER — Encounter (HOSPITAL_BASED_OUTPATIENT_CLINIC_OR_DEPARTMENT_OTHER): Payer: Self-pay | Admitting: *Deleted

## 2017-01-19 NOTE — Progress Notes (Signed)
NPO AFTER MN.  ARRIVE AT 0845.  NEEDS HG AND URINE PREG.   

## 2017-01-26 ENCOUNTER — Ambulatory Visit (HOSPITAL_BASED_OUTPATIENT_CLINIC_OR_DEPARTMENT_OTHER): Payer: 59 | Admitting: Anesthesiology

## 2017-01-26 ENCOUNTER — Ambulatory Visit (HOSPITAL_BASED_OUTPATIENT_CLINIC_OR_DEPARTMENT_OTHER)
Admission: RE | Admit: 2017-01-26 | Discharge: 2017-01-26 | Disposition: A | Discharge status: Home / Self Care | Payer: 59 | Source: Ambulatory Visit | Attending: Gynecologic Oncology | Admitting: Gynecologic Oncology

## 2017-01-26 ENCOUNTER — Encounter (HOSPITAL_BASED_OUTPATIENT_CLINIC_OR_DEPARTMENT_OTHER): Payer: Self-pay

## 2017-01-26 ENCOUNTER — Encounter (HOSPITAL_BASED_OUTPATIENT_CLINIC_OR_DEPARTMENT_OTHER)
Admission: RE | Disposition: A | Discharge status: Home / Self Care | Payer: Self-pay | Source: Ambulatory Visit | Attending: Gynecologic Oncology

## 2017-01-26 DIAGNOSIS — Z87891 Personal history of nicotine dependence: Secondary | ICD-10-CM | POA: Diagnosis not present

## 2017-01-26 DIAGNOSIS — D069 Carcinoma in situ of cervix, unspecified: Secondary | ICD-10-CM

## 2017-01-26 DIAGNOSIS — D060 Carcinoma in situ of endocervix: Secondary | ICD-10-CM | POA: Insufficient documentation

## 2017-01-26 HISTORY — DX: Presence of spectacles and contact lenses: Z97.3

## 2017-01-26 HISTORY — PX: CERVICAL CONIZATION W/BX: SHX1330

## 2017-01-26 HISTORY — DX: Carcinoma in situ of cervix, unspecified: D06.9

## 2017-01-26 LAB — POCT PREGNANCY, URINE: PREG TEST UR: NEGATIVE

## 2017-01-26 LAB — POCT HEMOGLOBIN-HEMACUE: HEMOGLOBIN: 13.7 g/dL (ref 12.0–15.0)

## 2017-01-26 SURGERY — CONE BIOPSY, CERVIX
Anesthesia: General | Site: Vagina

## 2017-01-26 MED ORDER — KETOROLAC TROMETHAMINE 30 MG/ML IJ SOLN
INTRAMUSCULAR | Status: AC
Start: 2017-01-26 — End: 2017-01-26
  Filled 2017-01-26: qty 1

## 2017-01-26 MED ORDER — MEPERIDINE HCL 25 MG/ML IJ SOLN
6.2500 mg | INTRAMUSCULAR | Status: DC | PRN
  Filled 2017-01-26: qty 1

## 2017-01-26 MED ORDER — KETOROLAC TROMETHAMINE 30 MG/ML IJ SOLN
INTRAMUSCULAR | Status: DC | PRN
  Administered 2017-01-26: 30 mg via INTRAVENOUS

## 2017-01-26 MED ORDER — PROPOFOL 10 MG/ML IV BOLUS
INTRAVENOUS | Status: AC
  Filled 2017-01-26: qty 20

## 2017-01-26 MED ORDER — ONDANSETRON HCL 4 MG/2ML IJ SOLN
INTRAMUSCULAR | Status: DC | PRN
  Administered 2017-01-26: 4 mg via INTRAVENOUS

## 2017-01-26 MED ORDER — FENTANYL CITRATE (PF) 100 MCG/2ML IJ SOLN
INTRAMUSCULAR | Status: DC | PRN
  Administered 2017-01-26: 25 ug via INTRAVENOUS
  Administered 2017-01-26: 50 ug via INTRAVENOUS

## 2017-01-26 MED ORDER — OXYCODONE-ACETAMINOPHEN 5-325 MG PO TABS: 1.0000 | ORAL_TABLET | ORAL | 0 refills | Status: DC | PRN

## 2017-01-26 MED ORDER — SILVER NITRATE-POT NITRATE 75-25 % EX MISC
CUTANEOUS | Status: AC
  Filled 2017-01-26: qty 1

## 2017-01-26 MED ORDER — ACETIC ACID 5 % SOLN
Status: DC | PRN
  Administered 2017-01-26: 1 via TOPICAL

## 2017-01-26 MED ORDER — HYDROMORPHONE HCL 2 MG/ML IJ SOLN
INTRAMUSCULAR | Status: AC
  Filled 2017-01-26: qty 1

## 2017-01-26 MED ORDER — LIDOCAINE-EPINEPHRINE (PF) 1 %-1:200000 IJ SOLN
INTRAMUSCULAR | Status: DC | PRN
  Administered 2017-01-26: 15 mL

## 2017-01-26 MED ORDER — HYDROMORPHONE HCL 1 MG/ML IJ SOLN
0.2500 mg | INTRAMUSCULAR | Status: DC | PRN
  Administered 2017-01-26 (×2): 0.25 mg via INTRAVENOUS
  Filled 2017-01-26: qty 0.5

## 2017-01-26 MED ORDER — ONDANSETRON HCL 4 MG/2ML IJ SOLN
INTRAMUSCULAR | Status: AC
  Filled 2017-01-26: qty 2

## 2017-01-26 MED ORDER — LIDOCAINE-EPINEPHRINE (PF) 1 %-1:200000 IJ SOLN
INTRAMUSCULAR | Status: AC
Start: 2017-01-26 — End: 2017-01-26
  Filled 2017-01-26: qty 30

## 2017-01-26 MED ORDER — DEXAMETHASONE SODIUM PHOSPHATE 10 MG/ML IJ SOLN
INTRAMUSCULAR | Status: AC
  Filled 2017-01-26: qty 1

## 2017-01-26 MED ORDER — LIDOCAINE-EPINEPHRINE 0.5 %-1:200000 IJ SOLN
INTRAMUSCULAR | Status: AC
  Filled 2017-01-26: qty 1

## 2017-01-26 MED ORDER — LIDOCAINE 2% (20 MG/ML) 5 ML SYRINGE
INTRAMUSCULAR | Status: AC
  Filled 2017-01-26: qty 5

## 2017-01-26 MED ORDER — SENNA 8.6 MG PO TABS: 1.0000 | ORAL_TABLET | Freq: Every day | ORAL | 0 refills | Status: DC

## 2017-01-26 MED ORDER — ACETIC ACID 5 % SOLN
Status: AC
  Filled 2017-01-26: qty 500

## 2017-01-26 MED ORDER — MIDAZOLAM HCL 2 MG/2ML IJ SOLN
INTRAMUSCULAR | Status: AC
  Filled 2017-01-26: qty 2

## 2017-01-26 MED ORDER — DEXAMETHASONE SODIUM PHOSPHATE 10 MG/ML IJ SOLN
INTRAMUSCULAR | Status: DC | PRN
  Administered 2017-01-26: 10 mg via INTRAVENOUS

## 2017-01-26 MED ORDER — PROPOFOL 10 MG/ML IV BOLUS
INTRAVENOUS | Status: DC | PRN
  Administered 2017-01-26: 170 mg via INTRAVENOUS

## 2017-01-26 MED ORDER — LACTATED RINGERS IV SOLN
INTRAVENOUS | Status: DC
  Administered 2017-01-26: 09:00:00 via INTRAVENOUS
  Filled 2017-01-26: qty 1000

## 2017-01-26 MED ORDER — ONDANSETRON HCL 4 MG/2ML IJ SOLN
4.0000 mg | Freq: Once | INTRAMUSCULAR | Status: DC | PRN
  Filled 2017-01-26: qty 2

## 2017-01-26 MED ORDER — LIDOCAINE 2% (20 MG/ML) 5 ML SYRINGE
INTRAMUSCULAR | Status: DC | PRN
  Administered 2017-01-26: 100 mg via INTRAVENOUS

## 2017-01-26 MED ORDER — MIDAZOLAM HCL 5 MG/5ML IJ SOLN
INTRAMUSCULAR | Status: DC | PRN
  Administered 2017-01-26: 2 mg via INTRAVENOUS

## 2017-01-26 MED ORDER — FENTANYL CITRATE (PF) 100 MCG/2ML IJ SOLN
INTRAMUSCULAR | Status: AC
  Filled 2017-01-26: qty 2

## 2017-01-26 MED ORDER — FERRIC SUBSULFATE 259 MG/GM EX SOLN
CUTANEOUS | Status: AC
  Filled 2017-01-26: qty 8

## 2017-01-26 MED ORDER — FERRIC SUBSULFATE SOLN
Status: DC | PRN
  Administered 2017-01-26: 1 via TOPICAL

## 2017-01-26 SURGICAL SUPPLY — 38 items
APPLICATOR COTTON TIP 6IN STRL (MISCELLANEOUS) ×2 IMPLANT
BLADE SURG 11 STRL SS (BLADE) ×2 IMPLANT
CANISTER SUCTION 2500CC (MISCELLANEOUS) ×1 IMPLANT
CATH ROBINSON RED A/P 16FR (CATHETERS) ×2 IMPLANT
CLEANER CAUTERY TIP 5X5 PAD (MISCELLANEOUS) ×1 IMPLANT
COVER BACK TABLE 60X90IN (DRAPES) ×2 IMPLANT
DRAPE LG THREE QUARTER DISP (DRAPES) ×2 IMPLANT
DRAPE UNDERBUTTOCKS STRL (DRAPE) ×2 IMPLANT
DRSG TELFA 3X8 NADH (GAUZE/BANDAGES/DRESSINGS) ×2 IMPLANT
ELECT BALL LEEP 5MM RED (ELECTRODE) IMPLANT
ELECT BLADE 6.5 .24CM SHAFT (ELECTRODE) ×2 IMPLANT
ELECT REM PT RETURN 9FT ADLT (ELECTROSURGICAL) ×2
ELECTRODE REM PT RTRN 9FT ADLT (ELECTROSURGICAL) ×1 IMPLANT
GLOVE BIO SURGEON STRL SZ 6 (GLOVE) ×2 IMPLANT
GOWN STRL REUS W/ TWL LRG LVL3 (GOWN DISPOSABLE) ×1 IMPLANT
GOWN STRL REUS W/TWL LRG LVL3 (GOWN DISPOSABLE)
KIT RM TURNOVER CYSTO AR (KITS) ×2 IMPLANT
LEGGING LITHOTOMY PAIR STRL (DRAPES) ×2 IMPLANT
MANIFOLD NEPTUNE II (INSTRUMENTS) IMPLANT
NDL SPNL 22GX3.5 QUINCKE BK (NEEDLE) ×1 IMPLANT
NEEDLE SPNL 22GX3.5 QUINCKE BK (NEEDLE) ×2 IMPLANT
NS IRRIG 500ML POUR BTL (IV SOLUTION) ×2 IMPLANT
PAD CLEANER CAUTERY TIP 5X5 (MISCELLANEOUS) ×1
PAD DRESSING TELFA 3X8 NADH (GAUZE/BANDAGES/DRESSINGS) ×1 IMPLANT
PAD PREP 24X48 CUFFED NSTRL (MISCELLANEOUS) ×2 IMPLANT
PENCIL BUTTON HOLSTER BLD 10FT (ELECTRODE) ×1 IMPLANT
SCOPETTES 8  STERILE (MISCELLANEOUS) ×3
SCOPETTES 8 STERILE (MISCELLANEOUS) ×1 IMPLANT
SPONGE SURGIFOAM ABS GEL 12-7 (HEMOSTASIS) IMPLANT
SUT VIC AB 0 CT1 36 (SUTURE) ×2 IMPLANT
SUT VIC AB 2-0 CT2 27 (SUTURE) IMPLANT
SUT VIC AB 2-0 UR6 27 (SUTURE) IMPLANT
SUT VICRYL 0 UR6 27IN ABS (SUTURE) ×4 IMPLANT
SYR BULB IRRIGATION 50ML (SYRINGE) ×2 IMPLANT
SYR CONTROL 10ML LL (SYRINGE) ×2 IMPLANT
TUBE CONNECTING 12X1/4 (SUCTIONS) ×2 IMPLANT
VACUUM HOSE/TUBING 7/8INX6FT (MISCELLANEOUS) IMPLANT
YANKAUER SUCT BULB TIP NO VENT (SUCTIONS) ×2 IMPLANT

## 2017-01-26 NOTE — Transfer of Care (Signed)
Immediate Anesthesia Transfer of Care Note  Patient: Jasmine HeaterKasie L Hunt  Procedure(s) Performed: Procedure(s) (LRB): COLD KNIFE CONIZATION CERVIX (N/A)  Patient Location: PACU  Anesthesia Type: General  Level of Consciousness: awake, oriented, sedated and patient cooperative  Airway & Oxygen Therapy: Patient Spontanous Breathing and Patient connected to face mask oxygen  Post-op Assessment: Report given to PACU RN and Post -op Vital signs reviewed and stable  Post vital signs: Reviewed and stable  Complications: No apparent anesthesia complications  Last Vitals:  Vitals:   01/26/17 0836 01/26/17 1154  BP: 105/63   Pulse: 88   Resp: 16 (P) 16  Temp: 37 C (P) 37.1 C

## 2017-01-26 NOTE — Anesthesia Preprocedure Evaluation (Signed)
Anesthesia Evaluation  Patient identified by MRN, date of birth, ID band Patient awake    Reviewed: Allergy & Precautions, NPO status , Patient's Chart, lab work & pertinent test results  Airway Mallampati: I  TM Distance: >3 FB Neck ROM: Full    Dental   Pulmonary former smoker,    Pulmonary exam normal       Cardiovascular Normal cardiovascular exam    Neuro/Psych    GI/Hepatic   Endo/Other    Renal/GU      Musculoskeletal   Abdominal   Peds  Hematology   Anesthesia Other Findings   Reproductive/Obstetrics                             Anesthesia Physical Anesthesia Plan  ASA: II  Anesthesia Plan: General   Post-op Pain Management:    Induction: Intravenous  Airway Management Planned: LMA  Additional Equipment:   Intra-op Plan:   Post-operative Plan: Extubation in OR  Informed Consent: I have reviewed the patients History and Physical, chart, labs and discussed the procedure including the risks, benefits and alternatives for the proposed anesthesia with the patient or authorized representative who has indicated his/her understanding and acceptance.     Plan Discussed with: CRNA and Surgeon  Anesthesia Plan Comments:         Anesthesia Quick Evaluation  

## 2017-01-26 NOTE — Op Note (Signed)
PATIENT: Jasmine Hernandez  ENCOUNTER DATE: 01/26/17   Preop Diagnosis: CIN 3  Postoperative Diagnosis: same  Surgery: cold knife conization of cervix   Surgeons:  Quinn Axeossi, Mayo Faulk Caroline, MD Assistant: none  Anesthesia: General   Estimated blood loss: <5 ml  IVF:  200ml   Urine output: 100 ml   Complications: None   Pathology: cervical cone with marking stitch at 12 o'clock anterior, post cone ECC  Operative findings: grossly normal appearing cervix. Some prominent acetowhite changes at 9 o'clock of endocervix.  Procedure: The patient was identified in the preoperative holding area. Informed consent was signed on the chart. Patient was seen history was reviewed and exam was performed.   The patient was then taken to the operating room and placed in the supine position with SCD hose on. General anesthesia was then induced without difficulty. She was then placed in the dorsolithotomy position. The perineum was prepped with Betadine. The vagina was prepped with Betadine. The patient was then draped after the prep was dried. An in and out catheterization to empty the bladder was performed under sterile conditions.  Timeout was performed the patient, procedure, antibiotic, allergy, and length of procedure.   The weighted speculum was placed in the posterior vagina. The right angle retractor was placed anteriorally to visualize the cervix. A 0-vicryl suture on a UR-6 needle was used to place stay sutures (which were tagged) at 3 and 9 o'clock of the cervicovaginal junction. 10cc of 1% lidocaine with epinephrine was infiltrated into 3 and 9 o'clock of the cervicovaginal junction, circumferentially around the face of the cervix, and deep in the endocervical canal. The uterine sound was placed in the cervix to delineate the course of the endocervical canal. An 11 blade scalpel on a long knife handle was used to make an incision around the face of the cervix, inside of the stay sutures,  circumferentially. A single tooth tenaculum grasped the specimen to manipulate it. The incision was then angled towards the endocervix to amputate the specimen. It was removed, oriented with a marking stitch at the 12 o'clock ectocervix and sent to pathology. It was measured to be 3cm deep, by 2x2cm on the ectocervical face.   A post-cone ECC was collected from the endocervical canal using a kavorkian currette.  The bovie with the 5mm ball attachment was used at 45 coag to create hemostasis at the surgical bed. Monsels solution was applied to the surgical bed to consolidate this hemostasis.  The vagina was irrigated.  All instrument, suture, laparotomy, Ray-Tec, and needle counts were correct x2. The patient tolerated the procedure well and was taken recovery room in stable condition. This is Jasmine Hernandez dictating an operative note on Jasmine Hernandez.

## 2017-01-26 NOTE — Anesthesia Procedure Notes (Signed)
Procedure Name: LMA Insertion Date/Time: 01/26/2017 11:13 AM Performed by: Renella CunasHAZEL, Finlee Milo D Pre-anesthesia Checklist: Patient identified, Emergency Drugs available, Suction available and Patient being monitored Patient Re-evaluated:Patient Re-evaluated prior to inductionOxygen Delivery Method: Circle system utilized Preoxygenation: Pre-oxygenation with 100% oxygen Intubation Type: IV induction Ventilation: Mask ventilation without difficulty LMA: LMA inserted LMA Size: 4.0 Number of attempts: 1 Airway Equipment and Method: Bite block Placement Confirmation: positive ETCO2 Tube secured with: Tape Dental Injury: Teeth and Oropharynx as per pre-operative assessment

## 2017-01-26 NOTE — Discharge Instructions (Signed)
Post Anesthesia Home Care Instructions  Activity: Get plenty of rest for the remainder of the day. A responsible adult should stay with you for 24 hours following the procedure.  For the next 24 hours, DO NOT: -Drive a car -Advertising copywriter -Drink alcoholic beverages -Take any medication unless instructed by your physician -Make any legal decisions or sign important papers.  Meals: Start with liquid foods such as gelatin or soup. Progress to regular foods as tolerated. Avoid greasy, spicy, heavy foods. If nausea and/or vomiting occur, drink only clear liquids until the nausea and/or vomiting subsides. Call your physician if vomiting continues.  Special Instructions/Symptoms: Your throat may feel dry or sore from the anesthesia or the breathing tube placed in your throat during surgery. If this causes discomfort, gargle with warm salt water. The discomfort should disappear within 24 hours.  If you had a scopolamine patch placed behind your ear for the management of post- operative nausea and/or vomiting:  1. The medication in the patch is effective for 72 hours, after which it should be removed.  Wrap patch in a tissue and discard in the trash. Wash hands thoroughly with soap and water. 2. You may remove the patch earlier than 72 hours if you experience unpleasant side effects which may include dry mouth, dizziness or visual disturbances. 3. Avoid touching the patch. Wash your hands with soap and water after contact with the patch.   Cold Knife Conization, Care After ACTIVITY  Rest as much as possible the first two days after discharge.  You do not have weight restrictions on lifting  Avoid strenuous working out such as running or lifting weights for 48-72 hours  You can climb stairs and drive a car NUTRITION  You may resume your normal diet.  Drink 6 to 8 glasses of fluids a day.  Eat a healthy, balanced diet including portions of food from the meat (protein), milk, fruit,  vegetable, and bread groups.  Your caregiver may recommend you take a multivitamin with iron.  ELIMINATION   If constipation occurs, drink more liquids, and add more fruits, vegetables, and bran to your diet. You may take a mild laxative, such as Milk of Magnesia, Metamucil, or a stool softener such as Colace, with permission from your caregiver.  HYGIENE  You may shower and wash your hair.  Avoid tub baths for 4 weeks  Do not add any bath oils or chemicals to your bath water, after you have permission to take baths.  Avoid placing anything in the vagina for 4 weeks.  It is normal to pass a brown-flecked discharge from the vagina for several weeks after your procedure.  HOME CARE INSTRUCTIONS   Take your temperature twice a day and record it, especially if you feel feverish or have chills.  Follow your caregiver's instructions about medicines, activity, and follow-up appointments after surgery.  Do not drink alcohol while taking pain medicine.  You may take over-the-counter medicine for pain, recommended by your caregiver.  If your pain is not relieved with medicine, call your caregiver.  Do not take aspirin because it can cause bleeding.  Do not douche or use tampons (use a nonperfumed sanitary pad).  Do not have sexual intercourse for 6 weeks postoperatively. Hugging, kissing, and playful sexual activity is fine with your caregiver's permission.  Take showers instead of baths, until your caregiver gives you permission to take baths.  You may take a mild medicine for constipation, recommended by your caregiver. Bran foods and drinking a lot  of fluids will help with constipation.  Make sure your family understands everything about your operation and recovery.  Medications:  - Take ibuprofen and tylenol first line for pain control. Take these regularly (every 6 hours) to decrease the build up of pain.  - If necessary, for severe pain not relieved by ibuprofen, take  percocet.  - While taking percocet you should take sennakot every night to reduce the likelihood of constipation. If this causes diarrhea, stop its use.  SEEK MEDICAL CARE IF:    You notice a foul smell coming from the vagina.  You have painful or bloody urination.  You develop nausea and vomiting.  You develop diarrhea.  You develop a rash.  You have a reaction or allergy from the medicine.  You feel dizzy or light-headed.  You need stronger pain medicine.   SEEK IMMEDIATE MEDICAL CARE IF:   You develop a temperature of 102 F (38.9 C) or higher.  You pass out.  You develop leg or chest pain.  You develop abdominal pain.  You develop shortness of breath.  You bleed heavier than a period (soaking through 2 or more pads per hour for 2 hours in a row).  You see pus in the wound area.  MAKE SURE YOU:   Understand these instructions.  Will watch your condition.  Will get help right away if you are not doing well or get worse. Document Released: 07/05/2004 Document Revised: 04/07/2014 Document Reviewed: 10/23/2009 [facility] Patient Information 2015 [place], [place]. This information is not intended to replace advice given to you by your health care provider. Make sure you discuss any questions you have with your health care provider.

## 2017-01-26 NOTE — Anesthesia Postprocedure Evaluation (Signed)
Anesthesia Post Note  Patient: Jasmine Hernandez  Procedure(s) Performed: Procedure(s) (LRB): COLD KNIFE CONIZATION CERVIX (N/A)  Patient location during evaluation: PACU Anesthesia Type: General Level of consciousness: awake and alert Pain management: pain level controlled Vital Signs Assessment: post-procedure vital signs reviewed and stable Respiratory status: spontaneous breathing, nonlabored ventilation, respiratory function stable and patient connected to nasal cannula oxygen Cardiovascular status: blood pressure returned to baseline and stable Postop Assessment: no signs of nausea or vomiting Anesthetic complications: no       Last Vitals:  Vitals:   01/26/17 1230 01/26/17 1245  BP: 93/61 (!) 97/58  Pulse: 96 93  Resp: 13 16  Temp:      Last Pain:  Vitals:   01/26/17 1245  TempSrc:   PainSc: 1                  Arrian Manson DAVID

## 2017-01-26 NOTE — H&P (Signed)
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  Plan: I reviewed the current Pap smear findings with the patient and her husband as well as her past history of a LEEP procedure with a positive margin. At this juncture with persistent abnormal Pap smear I would recommend the patient undergo a cold knife conization. Surgical procedure was reviewed with the patient her husband along with risks. The potential for CIN-3 to progressing to invasive cervical cancer was also reviewed. The patient will contact us to schedule surgery in the near future. It's recall that she has a Mirena IUD in place and hopefully we will be able to perform the conization and still leave the IUD in place.  Interval History: Patient returns today having undergone a repeat Pap smear on 11/21/2016. On that Pap smear high-grade squamous intraepithelial lesion is noted along with atypical glands which may indicate possible gland involvement. The patient remains entirely asymptomatic.  WUJ:WJXBJHPI:Jasmine L Huntis a 31 y.o.woman who is seen in consultation at the request of Dr. Kingsley SpittlePinnfor evaluation of CIN3.  She has a history of ASC-H pap smear which prompted a colposcopic evaluation and biopsies. These biopsies were normal but ECC showed HSIL. Therefore an excisional procedure (LEEP) and ECC were performed on 7/25. 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   At the initial consultation with gynecologic oncology the pros and cons of repeat excision versus observation were evaluated and discussed. She decided to follow the patient with repeat Pap smear. Repeat Pap smear on 11/21/2016 showed high-grade squamous intraepithelial lesion with atypical glands.   Review of Systems:10 point review of systems is negative except as noted in interval history.   Vitals: Blood pressure 129/75, pulse 99, temperature 98.8 F (37.1 C), temperature source Oral, resp. rate 17, height 5' 1.25" (1.556 m),  weight 169 lb 6.4 oz (76.8 kg), SpO2 100 %.  Physical Exam: General : The patient is a healthy woman in no acute distress.  HEENT: normocephalic, extraoccular movements normal; neck is supple without thyromegally  Lynphnodes: Supraclavicular and inguinal nodes not enlarged  Abdomen: Soft, non-tender, no ascites, no organomegally, no masses, no hernias     Lower extremities: No edema or varicosities. Normal range of motion      No Known Allergies      Past Medical History:  Diagnosis Date  . Depression   . ETOH abuse          Past Surgical History:  Procedure Laterality Date  . CESAREAN SECTION    . CESAREAN SECTION    . COLPOSCOPY N/A 06/28/2016   Procedure: COLPOSCOPY;  Surgeon: Essie HartWalda Pinn, MD;  Location: [facility];  Service: Gynecology;  Laterality: N/A;  . LEEP N/A 06/28/2016   Procedure: LOOP ELECTROSURGICAL EXCISION PROCEDURE (LEEP);  Surgeon: Essie HartWalda Pinn, MD;  Location: [facility];  Service: Gynecology;  Laterality: N/A;          Current Outpatient Prescriptions  Medication Sig Dispense Refill  . calcium carbonate (TUMS - DOSED IN MG ELEMENTAL CALCIUM) 500 MG chewable tablet Chew 4-5 tablets by mouth daily as needed for indigestion or heartburn.    Marland Kitchen. ibuprofen (ADVIL,MOTRIN) 200 MG tablet Take 4 tablets (800 mg total) by mouth every 8 (eight) hours as needed for moderate pain. 30 tablet 3  . levonorgestrel (MIRENA) 20 MCG/24HR IUD by Intrauterine route.     No current facility-administered medications for this visit.     Social History        Social History  .  Marital status: Married    Spouse name: N/A  . Number of children: N/A  . Years of education: N/A      Occupational History  . Not on file.        Social History Main Topics  . Smoking status: Former Smoker    Packs/day: 0.25    Years: 10.00    Types: Cigarettes    Quit date: 12/26/2013  . Smokeless tobacco: Never Used  . Alcohol use No  . Drug use: No  . Sexual  activity: Not Currently    Birth control/ protection: Abstinence   Other Topics Concern  . Not on file      Social History Narrative   ** Merged History Encounter **             Family History  Problem Relation Age of Onset  . Alcohol abuse Father   . Drug abuse Father   . Cancer Maternal Grandmother   . Cancer Maternal Grandfather   . Hypertension Paternal Grandmother      Andrey Farmer, Edwyna Shell, MD

## 2017-01-27 ENCOUNTER — Encounter (HOSPITAL_BASED_OUTPATIENT_CLINIC_OR_DEPARTMENT_OTHER): Payer: Self-pay | Admitting: Gynecologic Oncology

## 2017-01-27 ENCOUNTER — Telehealth: Payer: Self-pay | Admitting: *Deleted

## 2017-01-27 NOTE — Telephone Encounter (Signed)
Message left with post op appointment information. Pt to call office if needed for any questions or concerns or needs to reschedule.

## 2017-02-02 ENCOUNTER — Telehealth: Payer: Self-pay

## 2017-02-02 NOTE — Telephone Encounter (Signed)
Surgical pathology reported to patient, as well as follow up with her GYN in 6 months. Pt verbalized understanding and confirmed post op appt on 02/15/17.

## 2017-02-03 ENCOUNTER — Telehealth: Payer: Self-pay

## 2017-02-03 NOTE — Telephone Encounter (Signed)
Faxed records as requested from 8-20-7 through 02-03-17.

## 2017-02-15 ENCOUNTER — Encounter: Payer: Self-pay | Admitting: Gynecologic Oncology

## 2017-02-15 ENCOUNTER — Ambulatory Visit: Payer: 59 | Attending: Gynecologic Oncology | Admitting: Gynecologic Oncology

## 2017-02-15 ENCOUNTER — Ambulatory Visit (HOSPITAL_COMMUNITY)
Admission: RE | Admit: 2017-02-15 | Discharge: 2017-02-15 | Disposition: A | Discharge status: Home / Self Care | Payer: 59 | Source: Ambulatory Visit | Attending: Gynecologic Oncology | Admitting: Gynecologic Oncology

## 2017-02-15 VITALS — BP 116/65 | HR 84 | Temp 98.1°F | Resp 20 | Wt 164.9 lb

## 2017-02-15 DIAGNOSIS — Z86001 Personal history of in-situ neoplasm of cervix uteri: Secondary | ICD-10-CM | POA: Insufficient documentation

## 2017-02-15 DIAGNOSIS — Z975 Presence of (intrauterine) contraceptive device: Secondary | ICD-10-CM

## 2017-02-15 DIAGNOSIS — Z9889 Other specified postprocedural states: Secondary | ICD-10-CM | POA: Insufficient documentation

## 2017-02-15 DIAGNOSIS — D069 Carcinoma in situ of cervix, unspecified: Secondary | ICD-10-CM

## 2017-02-15 DIAGNOSIS — Z30431 Encounter for routine checking of intrauterine contraceptive device: Secondary | ICD-10-CM | POA: Insufficient documentation

## 2017-02-15 NOTE — Patient Instructions (Signed)
Register at Radiology for your Pelvic Ultrasound to check IUD placement. Our office will contact you with the results.

## 2017-02-15 NOTE — Progress Notes (Signed)
POSTOPERATIVE VISIT   Assessment:    31 y.o. year old with a history of high grade cervical dysplasia.   S/p cold knife conization on 01/26/17.   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 I discussed that this can be a recurrent process as the HPV affects all tissues of the lower genital tract. Therefore, in accordance with ASCCP guidelines I recommend repeat pap with HPV testing in 6months to 1 year. She was given the opportunity to ask questions, which were answered to her satisfaction, and she is agreement with the above mentioned plan of care.  3) Unable to see IUD strings on exam today. These were not visible in the OR. Will order TVUS today to evaluate for IUD placement. 4) Return to clinic on a prn basis - she will see Dr Mora ApplPinn for ongoing cervical cancer screening in 6-12 months.  HPI:  Jasmine HeaterKasie L Hernandez is a 31 y.o. 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  She then underwent a cold knife conization of the cervix on 01/26/17 without complications.  Her postoperative course was uncomplicated.  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  She is seen today for a postoperative check and to discuss her pathology results and ongoing plan.  Since discharge from the hospital, she is feeling well, though she has had some persistent spotting.  She has improving appetite, normal bowel and bladder function, and pain controlled with minimal PO medication. She has no other complaints today.    Review of systems: Constitutional:  She has no weight gain or weight loss. She has no fever or chills. Eyes: No blurred vision Ears, Nose, Mouth, Throat: No dizziness, headaches or changes in hearing. No mouth sores. Cardiovascular: No chest pain,  palpitations or edema. Respiratory:  No shortness of breath, wheezing or cough Gastrointestinal: She has normal bowel movements without diarrhea or constipation. She denies any nausea or vomiting. She denies blood in her stool or heart burn. Genitourinary:  She denies pelvic pain, pelvic pressure or changes in her urinary function. She has no hematuria, dysuria, or incontinence. + vaginal bleeding Musculoskeletal: Denies muscle weakness or joint pains.  Skin:  She has no skin changes, rashes or itching Neurological:  Denies dizziness or headaches. No neuropathy, no numbness or tingling. Psychiatric:  She denies depression or anxiety. Hematologic/Lymphatic:   No easy bruising or bleeding   Physical Exam: Blood pressure 116/65, pulse 84, temperature 98.1 F (36.7 C), temperature source Oral, resp. rate 20, weight 164 lb 14.4 oz (74.8 kg), SpO2 98 %. General: Well dressed, well nourished in no apparent distress.   HEENT:  Normocephalic and atraumatic, no lesions.  Extraocular muscles intact. Sclerae anicteric. Pupils equal, round, reactive. No mouth sores or ulcers. Thyroid is normal size, not nodular, midline. Genitourinary: Normal EGBUS  Cervical cone bed healing normally. Suture material present still. No IUD strings visible. Extremities: No cyanosis, clubbing or edema.  No calf tenderness or erythema. No palpable cords. Psychiatric: Mood and affect are appropriate. Neurological: Awake, alert and oriented x 3. Sensation is intact, no neuropathy.  Musculoskeletal: No pain, normal strength and range of motion.  Quinn Axeossi, Shaquetta Arcos Caroline, MD

## 2017-05-08 ENCOUNTER — Telehealth: Payer: Self-pay | Admitting: *Deleted

## 2017-05-08 NOTE — Telephone Encounter (Signed)
[facility]; Physicians called and requested a copy of the patient's last pap smear. Report faxed to that office and confirmation received.

## 2018-11-30 ENCOUNTER — Ambulatory Visit: Payer: 59 | Admitting: Psychology

## 2018-11-30 DIAGNOSIS — F4321 Adjustment disorder with depressed mood: Secondary | ICD-10-CM | POA: Diagnosis not present

## 2019-01-24 ENCOUNTER — Encounter: Payer: Self-pay | Admitting: Psychiatry

## 2019-01-24 ENCOUNTER — Ambulatory Visit (INDEPENDENT_AMBULATORY_CARE_PROVIDER_SITE_OTHER): Payer: 59 | Admitting: Psychiatry

## 2019-01-24 VITALS — BP 126/63 | HR 96 | Ht 61.0 in | Wt 170.0 lb

## 2019-01-24 DIAGNOSIS — F411 Generalized anxiety disorder: Secondary | ICD-10-CM

## 2019-01-24 DIAGNOSIS — F4001 Agoraphobia with panic disorder: Secondary | ICD-10-CM | POA: Diagnosis not present

## 2019-01-24 DIAGNOSIS — F4010 Social phobia, unspecified: Secondary | ICD-10-CM

## 2019-01-24 MED ORDER — SERTRALINE HCL 50 MG PO TABS: ORAL_TABLET | ORAL | 1 refills | Status: DC

## 2019-01-24 NOTE — Progress Notes (Signed)
[facility] MD/PA/NP Initial Note  01/24/2019 9:42 AM Jasmine Hernandez  MRN:  [id number]  Chief Complaint:  Chief Complaint    Other; Anxiety      HPI: 33 yo WF referred by [facility].  CC anxiety.   Went to PCP early January.  Marital and extended family issues.  Overwhelmed with demands in the family.  Tried cutting back on caffeine and going to gym.  Feeling tight in chest and sometimes SOB.  Not managing it as well.  PCP rx hydroxyzine and SE feeling funny.  Started Buspar 5 TID and that helped some.  Referred to [facility] but couldn't get meds.  Buspar increased to 10 TID prn and it sometimes helps but not consistent bc thought it was prn.  In the past had PPD and a lot of marital issues.  Rx Citalopram and had SE.  Also started on Xanax.  In recovery and sober 5 years.  History of drugs and alcohol.  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  DUC opiates and then alcohol.  Went to [facility] treatment for 52 days and sober since.  Detox a couple of times without help.  Took Celexa inconsistently in the past.  Anxiety is consistent underlying and bearable but the surges of anxiety are not bearable.  No full panic lately but has had them in the past.  Short bursts are not daily.  H can trigger it.  He drinks a lot and is a difficult person and unpredictable causes her anxiety.  H is un-medicated bipolar and self-medicates with drugs and alcohol and still cycles..  Sought help when started having anxiety at work too.  Don't like groups of people, even new NA meetings. Has some social anxiety, OK one on one.  Pt reports that mood is Anxious and describes anxiety as Moderate. Anxiety symptoms include: Excessive Worry, Panic Symptoms,. Intermittent irritability sometimes over nothing.  Pt reports has interrupted sleep, has frequent nighttime awakenings and less than 7 hours for months. Pt reports that appetite is good. Pt reports that energy is lethargic and down slightly. Concentration is down  slightly. Suicidal thoughts:  denied by patient.   No other Rx meds for anxiety. Visit Diagnosis:    ICD-10-CM   1. Generalized anxiety disorder F41.1   2. Panic disorder with agoraphobia F40.01   3. Social anxiety disorder F40.10     Past Psychiatric History: Counseling but never psychiatry.  Attends NA.  Rehabs mentioned.  No psych hospitalizations.  No SA  Past Medical History:  Past Medical History:  Diagnosis Date  . CIN III with severe dysplasia   . Depression   . Wears glasses     Past Surgical History:  Procedure Laterality Date  . CERVICAL CONIZATION W/BX N/A 01/26/2017   Procedure: COLD KNIFE CONIZATION CERVIX;  Surgeon: Adolphus Birchwood, MD;  Location: [facility];  Service: Gynecology;  Laterality: N/A;  . CESAREAN SECTION  02/13/2009  . COLPOSCOPY N/A 06/28/2016   Procedure: COLPOSCOPY;  Surgeon: Essie Hart, MD;  Location: [facility];  Service: Gynecology;  Laterality: N/A;  . LEEP N/A 06/28/2016   Procedure: LOOP ELECTROSURGICAL EXCISION PROCEDURE (LEEP);  Surgeon: Essie Hart, MD;  Location: [facility];  Service: Gynecology;  Laterality: N/A;    Family Psychiatric History: entered below.  Sons Age 87, 28, 21 yo.  1 B and 4 sisters unknown psych but all except 1 with D&A problems.  Family History:  Family History  Problem Relation Age of Onset  . Alcohol abuse Father   .  Drug abuse Father   . Cancer Maternal Grandmother   . Cancer Maternal Grandfather   . Depression Mother        maybe drug abuse  . Hypertension Paternal Grandmother   . Drug abuse Paternal Aunt   . 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 Social History   Socioeconomic History  . Marital status: Married    Spouse name: Not on file  . Number of children: Not on file  . Years of education: Not on file  . Highest education level: Not on file  Occupational History  . Not on file  Social Needs  . Financial resource strain: Not on file  . Food  insecurity:    Worry: Not on file    Inability: Not on file  . Transportation needs:    Medical: Not on file    Non-medical: Not on file  Tobacco Use  . Smoking status: Former Smoker    Packs/day: 0.25    Years: 10.00    Pack years: 2.50    Types: Cigarettes    Last attempt to quit: 12/26/2013    Years since quitting: 5.0  . Smokeless tobacco: Never Used  Substance and Sexual Activity  . Alcohol use: No  . Drug use: No    Types: Marijuana    Comment: quit 10/ 2014  . Sexual activity: Not on file    Comment: mirena IUD placed June  or July 2017  Lifestyle  . Physical activity:    Days per week: Not on file    Minutes per session: Not on file  . Stress: Not on file  Relationships  . Social connections:    Talks on phone: Not on file    Gets together: Not on file    Attends religious service: Not on file    Active member of club or organization: Not on file    Attends meetings of clubs or organizations: Not on file    Relationship status: Not on file  Other Topics Concern  . Not on file  Social History Narrative   ** Merged History Encounter **        Allergies: No Known Allergies  Metabolic Disorder Labs: No results found for: HGBA1C, MPG No results found for: PROLACTIN No results found for: CHOL, TRIG, HDL, CHOLHDL, VLDL, LDLCALC No results found for: TSH  Therapeutic Level Labs: No results found for: LITHIUM No results found for: VALPROATE No components found for:  CBMZ  Current Medications: Current Outpatient Medications  Medication Sig Dispense Refill  . busPIRone (BUSPAR) 10 MG tablet Take 10 mg by mouth 3 (three) times daily.    Marland Kitchen levonorgestrel (MIRENA) 20 MCG/24HR IUD by Intrauterine route. Per pt placed June or July 2017     No current facility-administered medications for this visit.     Medication Side Effects: none  Orders placed this visit:  No orders of the defined types were placed in this encounter.   Psychiatric Specialty Exam:  ROS   Blood pressure 126/63, pulse 96, height 5\' 1"  (1.549 m), weight 170 lb (77.1 kg).Body mass index is 32.12 kg/m.  General Appearance: Well Groomed  Eye Contact:  Good  Speech:  Clear and Coherent and Normal Rate  Volume:  Normal  Mood:  Anxious  Affect:  Anxious  Thought Process:  Goal Directed  Orientation:  Full (Time, Place, and Person)  Thought Content: Logical   Suicidal Thoughts:  No  Homicidal Thoughts:  No  Memory:  WNL  Judgement:  Good  Insight:  Good  Psychomotor Activity:  Normal  Concentration:  Concentration: Good  Recall:  Good  Fund of Knowledge: Good  Language: Good  Assets:  Communication Skills Desire for Improvement Financial Resources/Insurance Housing Leisure Time Physical Health Resilience Social Support Talents/Skills Transportation Vocational/Educational  ADL's:  Intact  Cognition: WNL  Prognosis:  Good   Screenings:  MDQ negative  Receiving Psychotherapy: Yes   Treatment Plan/Recommendations:  Discussed the diagnosis of her anxiety disorders which appear to be a blur between generalized anxiety and panic disorder.  She clearly has both.  Discussed the usual course of these diagnoses with and without treatment.  Discussed the high recurrence rate off medications however she has never been on a consistent SSRI for any length of time.  Discussed my recommendation of Zoloft is being the most versatile antianxiety medication plus being FDA approved for social anxiety which she also has.  However she is concerned about the potential sexual side effects.  We discussed the alternative of Viibryd if that were to occur.  We also discussed the buspirone may help reduce the sexual side effects associated with SSRIs.  Discussed side effects of SSRIs in general and specifically to Zoloft.  She will contact us if she has any problems with that.  Continue and be CW buspirone 10 TID.  As noted this could both be helpful for her persistent anxiety as well as reduce  the sexual side effects associated with Zoloft.  Add zoloft 50 daily.  Disc duration of treatment of at least several months and then can taper if well-controlled.    If sexual SE  Then Viibryd.  FU 5 weeks.  Meredith Staggersarey Cottle, MD, DFAPA    Lauraine Rinnearey G Cottle Jr, MD

## 2019-02-19 ENCOUNTER — Telehealth: Payer: Self-pay | Admitting: Psychiatry

## 2019-02-19 NOTE — Telephone Encounter (Signed)
Pt called ask for return call from nurse. Has a lot of anxiety. Next appt 03/08/19.   [phone]

## 2019-02-19 NOTE — Telephone Encounter (Signed)
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 She has 3 kids at home and having anxiety/panic attacks. Asking if you would reconsider giving her something. She's taking sertraline as prescribed.

## 2019-02-19 NOTE — Telephone Encounter (Signed)
Left voice mail to call back 

## 2019-02-19 NOTE — Telephone Encounter (Signed)
If tolerated increase buspirone to 10 mg 2 tablets 3 times daily.  Sometimes that will cause nausea or dizziness.  It could help in about a week.  However out out would also increase the sertraline to 100 mg daily.  It is a more potent antianxiety medication but will take 3 weeks to have any potential benefit.  It is an excellent antipanic medication.  We cannot prescribe benzodiazepines because of her history of substance abuse in case she asks about that.

## 2019-02-20 ENCOUNTER — Other Ambulatory Visit: Payer: Self-pay

## 2019-02-20 MED ORDER — BUSPIRONE HCL 10 MG PO TABS: 20.0000 mg | ORAL_TABLET | Freq: Three times a day (TID) | ORAL | 2 refills | Status: DC

## 2019-02-20 MED ORDER — SERTRALINE HCL 100 MG PO TABS: ORAL_TABLET | ORAL | 1 refills | Status: DC

## 2019-02-20 NOTE — Telephone Encounter (Signed)
Pt aware and agreed to increase. Instructed to call back if any problems or concerns. Would like update rx submitted to [facility].

## 2019-02-20 NOTE — Progress Notes (Signed)
Dosage increase on Buspar and Zoloft per last telephone note

## 2019-03-08 ENCOUNTER — Encounter: Payer: Self-pay | Admitting: Psychiatry

## 2019-03-08 ENCOUNTER — Ambulatory Visit (INDEPENDENT_AMBULATORY_CARE_PROVIDER_SITE_OTHER): Payer: 59 | Admitting: Psychiatry

## 2019-03-08 ENCOUNTER — Other Ambulatory Visit: Payer: Self-pay

## 2019-03-08 DIAGNOSIS — F4010 Social phobia, unspecified: Secondary | ICD-10-CM

## 2019-03-08 DIAGNOSIS — F4001 Agoraphobia with panic disorder: Secondary | ICD-10-CM | POA: Diagnosis not present

## 2019-03-08 DIAGNOSIS — F411 Generalized anxiety disorder: Secondary | ICD-10-CM | POA: Diagnosis not present

## 2019-03-08 NOTE — Progress Notes (Addendum)
Jasmine Hernandez [id number] 07-24-1986 33 y.o.  Subjective:   Patient ID:  Jasmine Hernandez is a 33 y.o. (DOB 07-24-1986) female.  Chief Complaint:  Chief Complaint  Patient presents with  . Anxiety  . Follow-up    med management    HPI Pinkie L Hernandez presents to the office today for follow-up of anxiety and initiating sertraline and buspirone.  Initially noticed anxiety super increased with panic over Covid.  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  Now feels better overall.  Not as anxious.  Med increase helped 2 weeks ago, mid March.  Last panic day before she called. Sleep is better with Zquil. Some awakening without it.  History of addiction.  Review of Systems:  Review of Systems  Gastrointestinal: Negative for diarrhea.  Neurological: Negative for tremors and weakness.    Medications: I have reviewed the patient's current medications.  Current Outpatient Medications  Medication Sig Dispense Refill  . busPIRone (BUSPAR) 10 MG tablet Take 2 tablets (20 mg total) by mouth 3 (three) times daily. 180 tablet 2  . levonorgestrel (MIRENA) 20 MCG/24HR IUD by Intrauterine route. Per pt placed June or July 2017    . sertraline (ZOLOFT) 100 MG tablet 1 tablet daily 30 tablet 1   No current facility-administered medications for this visit.     Medication Side Effects: None  Allergies: No Known Allergies  Past Medical History:  Diagnosis Date  . CIN III with severe dysplasia   . Depression   . Wears glasses     Family History  Problem Relation Age of Onset  . Alcohol abuse Father   . Drug abuse Father   . Cancer Maternal Grandmother   . Cancer Maternal Grandfather   . Depression Mother        maybe drug abuse  . Hypertension Paternal Grandmother   . Anxiety disorder Paternal Grandmother   . Depression Paternal Grandmother   . Drug abuse Paternal Aunt   . Drug abuse Paternal Uncle     Social History   Socioeconomic History  . Marital status:  Married    Spouse name: Not on file  . Number of children: Not on file  . Years of education: Not on file  . Highest education level: Not on file  Occupational History  . Not on file  Social Needs  . Financial resource strain: Not on file  . Food insecurity:    Worry: Not on file    Inability: Not on file  . Transportation needs:    Medical: Not on file    Non-medical: Not on file  Tobacco Use  . Smoking status: Former Smoker    Packs/day: 0.25    Years: 10.00    Pack years: 2.50    Types: Cigarettes    Last attempt to quit: 12/26/2013    Years since quitting: 5.2  . Smokeless tobacco: Never Used  Substance and Sexual Activity  . Alcohol use: No  . Drug use: No    Types: Marijuana    Comment: quit 10/ 2014  . Sexual activity: Not on file    Comment: mirena IUD placed June  or July 2017  Lifestyle  . Physical activity:    Days per week: Not on file    Minutes per session: Not on file  . Stress: Not on file  Relationships  . Social connections:    Talks on phone: Not on file    Gets together: Not on file    Attends religious service: Not  on file    Active member of club or organization: Not on file    Attends meetings of clubs or organizations: Not on file    Relationship status: Not on file  . Intimate partner violence:    Fear of current or ex partner: Not on file    Emotionally abused: Not on file    Physically abused: Not on file    Forced sexual activity: Not on file  Other Topics Concern  . Not on file  Social History Narrative   ** Merged History Encounter **        Past Medical History, Surgical history, Social history, and Family history were reviewed and updated as appropriate.   Please see review of systems for further details on the patient's review from today.   Objective:   Physical Exam:  There were no vitals taken for this visit.  Physical Exam Neurological:     Mental Status: She is alert and oriented to person, place, and time.      Cranial Nerves: No dysarthria.  Psychiatric:        Attention and Perception: Attention normal.        Mood and Affect: Mood is anxious.        Speech: Speech normal.        Behavior: Behavior is cooperative.        Thought Content: Thought content normal. Thought content is not paranoid or delusional. Thought content does not include homicidal or suicidal ideation. Thought content does not include homicidal or suicidal plan.        Cognition and Memory: Cognition and memory normal.        Judgment: Judgment normal.     Lab Review:     Component Value Date/Time   NA 135 09/11/2013 1206   K 3.7 09/11/2013 1206   CL 100 09/11/2013 1206   CO2 22 09/11/2013 1206   GLUCOSE 82 09/11/2013 1206   BUN 9 09/11/2013 1206   CREATININE 0.64 09/11/2013 1206   CALCIUM 9.0 09/11/2013 1206   PROT 6.8 09/11/2013 1206   ALBUMIN 3.8 09/11/2013 1206   AST 32 09/11/2013 1206   ALT 27 09/11/2013 1206   ALKPHOS 52 09/11/2013 1206   BILITOT 0.3 09/11/2013 1206   GFRNONAA >90 09/11/2013 1206   GFRAA >90 09/11/2013 1206       Component Value Date/Time   WBC 7.1 06/28/2016 1055   RBC 4.46 06/28/2016 1055   HGB 13.7 01/26/2017 0909   HCT 39.4 06/28/2016 1055   PLT 227 06/28/2016 1055   MCV 88.3 06/28/2016 1055   MCH 30.0 06/28/2016 1055   MCHC 34.0 06/28/2016 1055   RDW 13.6 06/28/2016 1055   LYMPHSABS 2.5 01/12/2011 1704   MONOABS 0.8 01/12/2011 1704   EOSABS 0.2 01/12/2011 1704   BASOSABS 0.0 01/12/2011 1704    No results found for: POCLITH, LITHIUM   No results found for: PHENYTOIN, PHENOBARB, VALPROATE, CBMZ   .res Assessment: Plan:    Panic disorder with agoraphobia  Generalized anxiety disorder  Social anxiety disorder  History of addiction.  Her anxiety originally worsened after the medication but since the dosage increase mid-March to 100 mg of sertraline daily her anxiety has improved.  She has not reached the time of maximum benefit at this point.  We can expect further  improvement.  We discussed side effects.  She is tolerating the medication well.  She is also tolerating the buspirone and compliant.  No med changes this visit. Continue  sertraline 100 mg daily. Continue buspirone 10 mg tablets, 2 tablets 3 times a day  Follow-up 6 weeks  I connected with patient by a video enabled telemedicine application or telephone, with their informed consent, and verified patient privacy and that I am speaking with the correct person using two identifiers.  I was located at office and patient at work.   Meredith Staggers MD, DFAPA  Please see After Visit Summary for patient specific instructions.  No future appointments.  No orders of the defined types were placed in this encounter.     -------------------------------

## 2019-03-14 ENCOUNTER — Other Ambulatory Visit: Payer: Self-pay | Admitting: Psychiatry

## 2019-07-02 ENCOUNTER — Other Ambulatory Visit: Payer: Self-pay | Admitting: Psychiatry

## 2019-07-02 ENCOUNTER — Telehealth: Payer: Self-pay | Admitting: Psychiatry

## 2019-07-02 MED ORDER — MIRTAZAPINE 15 MG PO TABS: 15.0000 mg | ORAL_TABLET | Freq: Every day | ORAL | 0 refills | Status: DC

## 2019-07-02 NOTE — Telephone Encounter (Signed)
History of drug and alcohol abuse.  Complaining of insomnia.  Had nightmares from trazodone.  Has tried hydroxyzine.  Sent in prescription for mirtazapine 15 mg 1 nightly for insomnia.

## 2019-07-02 NOTE — Telephone Encounter (Signed)
Pt called to advise. She's having a very hard time sleeping. Takes OTC meds to fall a sleep.( Zinc oil with melatonin). Wakes up around  1:00 AM and can't go back to sleep. Can't take Trazodone has bad dreams. Taking meds as prescribed having breakthroughs with anxiety.

## 2019-07-03 NOTE — Telephone Encounter (Signed)
I cannot give her a controlled substance.  Low-dose mirtazapine does not typically cause weight gain but if she would prefer an alternative offer doxepin 10 mg 1 nightly or up to 2 nightly

## 2019-07-04 ENCOUNTER — Other Ambulatory Visit: Payer: Self-pay

## 2019-07-04 MED ORDER — DOXEPIN HCL 10 MG PO CAPS: ORAL_CAPSULE | ORAL | 0 refills | Status: DC

## 2019-07-04 NOTE — Telephone Encounter (Signed)
rx sent in 

## 2019-07-04 NOTE — Telephone Encounter (Signed)
She would like to try the Doxepin if you could please send that in and said thank you very much.

## 2019-07-09 ENCOUNTER — Telehealth: Payer: Self-pay | Admitting: Psychiatry

## 2019-07-09 NOTE — Telephone Encounter (Signed)
Jasmine Hernandez called to say that she is experiencing anxiety issues.  Needs to know options of how to help the anxiety.  No appt scheduled.  Please call to advise.

## 2019-07-10 ENCOUNTER — Other Ambulatory Visit: Payer: Self-pay | Admitting: Psychiatry

## 2019-07-10 DIAGNOSIS — F4001 Agoraphobia with panic disorder: Secondary | ICD-10-CM

## 2019-07-10 MED ORDER — HYDROXYZINE HCL 25 MG PO TABS: 25.0000 mg | ORAL_TABLET | Freq: Three times a day (TID) | ORAL | 1 refills | Status: DC | PRN

## 2019-07-10 MED ORDER — PROPRANOLOL HCL 20 MG PO TABS: 20.0000 mg | ORAL_TABLET | Freq: Two times a day (BID) | ORAL | 1 refills | Status: DC | PRN

## 2019-07-10 NOTE — Telephone Encounter (Signed)
Patient is asking for something quicker acting for anxiety.  We will offer propranolol 20 to 40 mg twice daily as needed anxiety or hydroxyzine 25 mg 3 times daily as needed anxiety.  Neither of these has any risk of addiction or abuse.  Scription was sent in for each of them

## 2019-07-10 NOTE — Telephone Encounter (Signed)
Pt. Was crying when she picked up the phone. She is "having a really hard time". She said the buspar worked for her anxiety initially but it does not help anymore. She is having panic attacks with her heart racing and feels like she is going to lose mind. She states that increasing the Sertraline takes a while before it is effective. Pt. Wants to know is there anything else you can do for her or give her while she waits for the new dose of Sertraline to work? Anything to help her with her frequent panic attacks. Please advise.

## 2019-07-10 NOTE — Progress Notes (Signed)
Patient is asking for something quicker acting for anxiety.  We will offer propranolol 20 to 40 mg twice daily as needed anxiety or hydroxyzine 25 mg 3 times daily as needed anxiety.  Neither of these has any risk of addiction or abuse.

## 2019-07-10 NOTE — Telephone Encounter (Signed)
Left voicemail to call back and discuss her anxiety

## 2019-07-10 NOTE — Telephone Encounter (Signed)
Pt. Made aware and she is going to go with the Propranolol. She said she has tried the Hydroxyzine before and the antihistamine in it drys her nasal passages and eyes up. She said thank you very much for sending her something in.

## 2019-07-10 NOTE — Telephone Encounter (Signed)
Sertraline is an excellent antianxiety medication and works better the higher you go in the dose.  She is only taking 100 mg daily.  Increase sertraline to 1-1/2 100 mg tablets daily which is 150 mg daily.  Move appointment  Schedule patient 30 minutes in about 4-week

## 2019-07-17 ENCOUNTER — Other Ambulatory Visit: Payer: Self-pay | Admitting: Psychiatry

## 2019-07-17 DIAGNOSIS — F4001 Agoraphobia with panic disorder: Secondary | ICD-10-CM

## 2019-07-18 ENCOUNTER — Telehealth: Payer: Self-pay | Admitting: Psychiatry

## 2019-07-18 NOTE — Telephone Encounter (Signed)
Jasmine Hernandez left a VM msg stating she would like to extend a huge Thank You for Dr [facility] nurse taking extra measures to help her in her time of crisis. She also stated the medication did help.

## 2019-07-30 ENCOUNTER — Other Ambulatory Visit: Payer: Self-pay | Admitting: Psychiatry

## 2019-07-31 NOTE — Telephone Encounter (Signed)
Requesting 90 day last appt 01/2019 nothing scheduled

## 2019-07-31 NOTE — Telephone Encounter (Signed)
Call to reschedule

## 2019-08-01 NOTE — Telephone Encounter (Signed)
Lm for for patient to schedule follow up appointment

## 2019-09-03 ENCOUNTER — Encounter: Payer: Self-pay | Admitting: Psychiatry

## 2019-09-03 ENCOUNTER — Other Ambulatory Visit: Payer: Self-pay

## 2019-09-03 ENCOUNTER — Ambulatory Visit (INDEPENDENT_AMBULATORY_CARE_PROVIDER_SITE_OTHER): Payer: 59 | Admitting: Psychiatry

## 2019-09-03 DIAGNOSIS — F4001 Agoraphobia with panic disorder: Secondary | ICD-10-CM

## 2019-09-03 DIAGNOSIS — F411 Generalized anxiety disorder: Secondary | ICD-10-CM

## 2019-09-03 DIAGNOSIS — F4010 Social phobia, unspecified: Secondary | ICD-10-CM | POA: Diagnosis not present

## 2019-09-03 MED ORDER — SERTRALINE HCL 50 MG PO TABS: 125.0000 mg | ORAL_TABLET | Freq: Every day | ORAL | 1 refills | Status: DC

## 2019-09-03 MED ORDER — BUSPIRONE HCL 30 MG PO TABS: 30.0000 mg | ORAL_TABLET | Freq: Two times a day (BID) | ORAL | 0 refills | Status: DC

## 2019-09-03 MED ORDER — QUETIAPINE FUMARATE 25 MG PO TABS: 25.0000 mg | ORAL_TABLET | Freq: Every day | ORAL | 0 refills | Status: DC

## 2019-09-03 NOTE — Progress Notes (Signed)
Jasmine Hernandez [id number] 1986/01/15 32 y.o.  Subjective:   Patient ID:  Jasmine Hernandez is a 33 y.o. (DOB 08-31-1986) female.  Chief Complaint:  Chief Complaint  Patient presents with  . Anxiety    HPI Jasmine Hernandez presents to the office today for follow-up of anxiety and initiating sertraline and buspirone.  Last seen March 08, 2019.  Sertraline had just been increased to 100 mg daily in March and she was seeing improvement.  No meds were changed at that visit.  She called in late July complaining of insomnia and doxepin was called in.  She called again on July 09, 2019 complaining of worsening anxiety including panic attacks.  She stated that the doxepin was not helping her sleep.  She was instructed to increase the sertraline to 150 mg daily.  She was asking for faster acting medications and appear to be asking for benzodiazepines but because of her history of addiction that was denied.  She was given propranolol 20 to 40 mg twice daily or hydroxyzine 25 mg 3 times daily as needed for anxiety.  Sleep irregular.  Doxepin helps 20 mg hangover but manageable and still wakes up through the night. Awakens every couple of hours but usually back to sleep easily.  Past couple of mos 2 occassions of little sleep for 2 -3 nights during added stress.  Avg 5-6 hours of sleep.  Normally needs 8 hours of sleep.  Took 150 mg sertraline for 4 weeks and then reduced it.  Didn't feel like herself bc couldn't cry at a funeral.  Uncertain effect on anxiety at the higher dose.  Overall anxiety is better now than when she called.  Was under a lot of stress at the time.  Sometimes panic about 2 month.  Seems like a build up of things.  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  Initially noticed anxiety super increased with panic over Covid.  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    A spell of depression with stress.  Remained very functional.  Sometimes can dwell on  stressors.  Chronic work stress and marital issues come and go.  Outside family issues also stress her.  Uncertain effect from propranolol.     Caffeine coffee AM and none after 3 pm.  History of addiction and family history of addiction.. In recovery and sober 5 years.  History of drugs and alcohol.  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  DUC opiates and then alcohol.  Went to [facility] treatment for 52 days and sober since.  Detox a couple of times without help.  Past Psychiatric Medication Trials: Trazodone bad dreams, doxepin 10  Citalopram side effects, buspirone 10 3 times daily   Review of Systems:  Review of Systems  Gastrointestinal: Negative for diarrhea.  Neurological: Negative for tremors and weakness.    Medications: I have reviewed the patient's current medications.  Current Outpatient Medications  Medication Sig Dispense Refill  . busPIRone (BUSPAR) 10 MG tablet TAKE 2 TABLETS (20 MG TOTAL) BY MOUTH 3 (THREE) TIMES DAILY. 540 tablet 1  . doxepin (SINEQUAN) 10 MG capsule TAKE 1-2 CAPSULES AT NIGHT AS NEEDED 180 capsule 0  . levonorgestrel (MIRENA) 20 MCG/24HR IUD by Intrauterine route. Per pt placed June or July 2017    . propranolol (INDERAL) 20 MG tablet TAKE 1-2 TABLETS (20-40 MG TOTAL) BY MOUTH 2 (TWO) TIMES DAILY AS NEEDED (ANXIETY). 60 tablet 1  . sertraline (ZOLOFT) 100 MG tablet TAKE 1 TABLET BY MOUTH EVERY DAY 90  tablet 1  . hydrOXYzine (ATARAX/VISTARIL) 25 MG tablet Take 1 tablet (25 mg total) by mouth 3 (three) times daily as needed. (Patient not taking: Reported on 09/03/2019) 30 tablet 1  . mirtazapine (REMERON) 15 MG tablet TAKE 1 TABLET BY MOUTH EVERYDAY AT BEDTIME (Patient not taking: Reported on 09/03/2019) 90 tablet 0   No current facility-administered medications for this visit.     Medication Side Effects: None CO numbness at 150 mg sertraline  Allergies: No Known Allergies  Past Medical History:  Diagnosis Date  . CIN III with severe dysplasia   .  Depression   . Wears glasses     Family History  Problem Relation Age of Onset  . Alcohol abuse Father   . Drug abuse Father   . Cancer Maternal Grandmother   . Cancer Maternal Grandfather   . Depression Mother        maybe drug abuse  . Hypertension Paternal Grandmother   . Anxiety disorder Paternal Grandmother   . Depression Paternal Grandmother   . Drug abuse Paternal Aunt   . Drug abuse Paternal Uncle     Social History   Socioeconomic History  . Marital status: Married    Spouse name: Not on file  . Number of children: Not on file  . Years of education: Not on file  . Highest education level: Not on file  Occupational History  . Not on file  Social Needs  . Financial resource strain: Not on file  . Food insecurity    Worry: Not on file    Inability: Not on file  . Transportation needs    Medical: Not on file    Non-medical: Not on file  Tobacco Use  . Smoking status: Former Smoker    Packs/day: 0.25    Years: 10.00    Pack years: 2.50    Types: Cigarettes    Quit date: 12/26/2013    Years since quitting: 5.6  . Smokeless tobacco: Never Used  Substance and Sexual Activity  . Alcohol use: No  . Drug use: No    Types: Marijuana    Comment: quit 10/ 2014  . Sexual activity: Not on file    Comment: mirena IUD placed June  or July 2017  Lifestyle  . Physical activity    Days per week: Not on file    Minutes per session: Not on file  . Stress: Not on file  Relationships  . Social Herbalist on phone: Not on file    Gets together: Not on file    Attends religious service: Not on file    Active member of club or organization: Not on file    Attends meetings of clubs or organizations: Not on file    Relationship status: Not on file  . Intimate partner violence    Fear of current or ex partner: Not on file    Emotionally abused: Not on file    Physically abused: Not on file    Forced sexual activity: Not on file  Other Topics Concern  . Not  on file  Social History Narrative   ** Merged History Encounter **        Past Medical History, Surgical history, Social history, and Family history were reviewed and updated as appropriate.   Please see review of systems for further details on the patient's review from today.   Objective:   Physical Exam:  There were no vitals taken for this visit.  Physical Exam Constitutional:      General: She is not in acute distress.    Appearance: She is well-developed.  Musculoskeletal:        General: No deformity.  Neurological:     Mental Status: She is alert and oriented to person, place, and time.     Cranial Nerves: No dysarthria.     Coordination: Coordination normal.  Psychiatric:        Attention and Perception: Attention and perception normal. She does not perceive auditory or visual hallucinations.        Mood and Affect: Mood is anxious. Mood is not depressed. Affect is tearful. Affect is not labile, blunt, angry or inappropriate.        Speech: Speech normal.        Behavior: Behavior normal. Behavior is cooperative.        Thought Content: Thought content normal. Thought content is not paranoid or delusional. Thought content does not include homicidal or suicidal ideation. Thought content does not include homicidal or suicidal plan.        Cognition and Memory: Cognition and memory normal.        Judgment: Judgment normal.     Comments: Insight intact. No delusions.      Lab Review:     Component Value Date/Time   NA 135 09/11/2013 1206   K 3.7 09/11/2013 1206   CL 100 09/11/2013 1206   CO2 22 09/11/2013 1206   GLUCOSE 82 09/11/2013 1206   BUN 9 09/11/2013 1206   CREATININE 0.64 09/11/2013 1206   CALCIUM 9.0 09/11/2013 1206   PROT 6.8 09/11/2013 1206   ALBUMIN 3.8 09/11/2013 1206   AST 32 09/11/2013 1206   ALT 27 09/11/2013 1206   ALKPHOS 52 09/11/2013 1206   BILITOT 0.3 09/11/2013 1206   GFRNONAA >90 09/11/2013 1206   GFRAA >90 09/11/2013 1206        Component Value Date/Time   WBC 7.1 06/28/2016 1055   RBC 4.46 06/28/2016 1055   HGB 13.7 01/26/2017 0909   HCT 39.4 06/28/2016 1055   PLT 227 06/28/2016 1055   MCV 88.3 06/28/2016 1055   MCH 30.0 06/28/2016 1055   MCHC 34.0 06/28/2016 1055   RDW 13.6 06/28/2016 1055   LYMPHSABS 2.5 01/12/2011 1704   MONOABS 0.8 01/12/2011 1704   EOSABS 0.2 01/12/2011 1704   BASOSABS 0.0 01/12/2011 1704    No results found for: POCLITH, LITHIUM   No results found for: PHENYTOIN, PHENOBARB, VALPROATE, CBMZ   .res Assessment: Plan:    Panic disorder with agoraphobia  Generalized anxiety disorder  Social anxiety disorder  History of addiction.  Doesn't seem to be on adequate dose of SSRI to control anxiety but is chronically stressed and has occ panic plus some insomnia.  She had recent worsening of panic attacks and was recommended increase sertraline to 150 mg from 100 mg.  She complained of feeling too flat after doing it for 4 weeks and reduce the dose.  Her stress level has remained high due to a variety of stressors which she indicates will be chronic.  We had an extensive discussion about whether to switch to a different SSRI in order to help reduce the flatness complaints but better control and block panic and anxiety.  The alternatives discussed were fluoxetine, fluvoxamine, paroxetine, etc. the other alternative was to use an intermediate dose of sertraline at 125 mg a day.  Following med changes: Increase sertraline 125 mg daily. Continue buspirone but change  to 30 mg BID and be consistent for better anxiety control.  This should also improve compliance.  She has not been consistent with the buspirone in part because she did not understand that consistency would help the effectiveness.  Disc in detail her history of addiction and her request for brief Bz for crises.  Says she won't abuse Xanax bc she had a few in her house that her mother gave her and she only took a few of them.  This  was discussed extensively including the fact that she has a history of alcohol dependence and benzodiazepines bound to the same GABA risk scepter that alcohol does and it could trigger off an alcohol relapse.  She indicates she is had a few Xanax in the house and has not abused them but wants them as needed panic.  She agrees to defer this per my recommendation.  Quetiapine 25 mg prn severe anxiety  Follow-up 6 weeks  Meredith Staggersarey Cottle MD, DFAPA  Please see After Visit Summary for patient specific instructions.  No future appointments.  No orders of the defined types were placed in this encounter.     -------------------------------

## 2019-09-08 ENCOUNTER — Telehealth: Payer: Self-pay

## 2019-09-08 NOTE — Telephone Encounter (Signed)
Prior authorization submitted and approved for sertraline 50 mg 2.5 tablets daily effective 08/06/2019-09/04/2022 through [employer]

## 2019-09-25 ENCOUNTER — Other Ambulatory Visit: Payer: Self-pay | Admitting: Psychiatry

## 2019-10-01 ENCOUNTER — Other Ambulatory Visit: Payer: Self-pay | Admitting: Psychiatry

## 2019-10-22 ENCOUNTER — Ambulatory Visit (HOSPITAL_COMMUNITY)
Admission: EM | Admit: 2019-10-22 | Discharge: 2019-10-22 | Disposition: A | Discharge status: Home / Self Care | Payer: 59 | Attending: Family Medicine | Admitting: Family Medicine

## 2019-10-22 ENCOUNTER — Encounter (HOSPITAL_COMMUNITY): Payer: Self-pay

## 2019-10-22 DIAGNOSIS — R519 Headache, unspecified: Secondary | ICD-10-CM | POA: Insufficient documentation

## 2019-10-22 DIAGNOSIS — R109 Unspecified abdominal pain: Secondary | ICD-10-CM | POA: Insufficient documentation

## 2019-10-22 DIAGNOSIS — R1013 Epigastric pain: Secondary | ICD-10-CM

## 2019-10-22 DIAGNOSIS — R110 Nausea: Secondary | ICD-10-CM | POA: Diagnosis not present

## 2019-10-22 DIAGNOSIS — R197 Diarrhea, unspecified: Secondary | ICD-10-CM | POA: Diagnosis not present

## 2019-10-22 DIAGNOSIS — F419 Anxiety disorder, unspecified: Secondary | ICD-10-CM | POA: Insufficient documentation

## 2019-10-22 DIAGNOSIS — Z3202 Encounter for pregnancy test, result negative: Secondary | ICD-10-CM

## 2019-10-22 DIAGNOSIS — K219 Gastro-esophageal reflux disease without esophagitis: Secondary | ICD-10-CM | POA: Diagnosis not present

## 2019-10-22 DIAGNOSIS — R6883 Chills (without fever): Secondary | ICD-10-CM | POA: Diagnosis not present

## 2019-10-22 DIAGNOSIS — Z793 Long term (current) use of hormonal contraceptives: Secondary | ICD-10-CM | POA: Diagnosis not present

## 2019-10-22 DIAGNOSIS — Z20828 Contact with and (suspected) exposure to other viral communicable diseases: Secondary | ICD-10-CM | POA: Insufficient documentation

## 2019-10-22 DIAGNOSIS — Z79899 Other long term (current) drug therapy: Secondary | ICD-10-CM | POA: Insufficient documentation

## 2019-10-22 DIAGNOSIS — F329 Major depressive disorder, single episode, unspecified: Secondary | ICD-10-CM | POA: Diagnosis not present

## 2019-10-22 DIAGNOSIS — R531 Weakness: Secondary | ICD-10-CM | POA: Diagnosis not present

## 2019-10-22 DIAGNOSIS — Z87891 Personal history of nicotine dependence: Secondary | ICD-10-CM | POA: Insufficient documentation

## 2019-10-22 LAB — POCT URINALYSIS DIP (DEVICE)
Bilirubin Urine: NEGATIVE
Glucose, UA: NEGATIVE mg/dL
Ketones, ur: NEGATIVE mg/dL
Leukocytes,Ua: NEGATIVE
Nitrite: NEGATIVE
Protein, ur: NEGATIVE mg/dL
Specific Gravity, Urine: 1.01 (ref 1.005–1.030)
Urobilinogen, UA: 0.2 mg/dL (ref 0.0–1.0)
pH: 6.5 (ref 5.0–8.0)

## 2019-10-22 LAB — COMPREHENSIVE METABOLIC PANEL
ALT: 20 U/L (ref 0–44)
AST: 22 U/L (ref 15–41)
Albumin: 3.7 g/dL (ref 3.5–5.0)
Alkaline Phosphatase: 41 U/L (ref 38–126)
Anion gap: 8 (ref 5–15)
BUN: <5 mg/dL — ABNORMAL LOW (ref 6–20)
CO2: 24 mmol/L (ref 22–32)
Calcium: 8.8 mg/dL — ABNORMAL LOW (ref 8.9–10.3)
Chloride: 106 mmol/L (ref 98–111)
Creatinine, Ser: 0.93 mg/dL (ref 0.44–1.00)
GFR calc Af Amer: >60 mL/min (ref >60)
GFR calc non Af Amer: >60 mL/min (ref >60)
Glucose, Bld: 98 mg/dL (ref 70–99)
Potassium: 4.3 mmol/L (ref 3.5–5.1)
Sodium: 138 mmol/L (ref 135–145)
Total Bilirubin: 0.7 mg/dL (ref 0.3–1.2)
Total Protein: 6.4 g/dL — ABNORMAL LOW (ref 6.5–8.1)

## 2019-10-22 LAB — CBC
HCT: 38 % (ref 36.0–46.0)
Hemoglobin: 12.9 g/dL (ref 12.0–15.0)
MCH: 31.6 pg (ref 26.0–34.0)
MCHC: 33.9 g/dL (ref 30.0–36.0)
MCV: 93.1 fL (ref 80.0–100.0)
Platelets: 279 10*3/uL (ref 150–400)
RBC: 4.08 MIL/uL (ref 3.87–5.11)
RDW: 13.2 % (ref 11.5–15.5)
WBC: 5.4 10*3/uL (ref 4.0–10.5)
nRBC: 0 % (ref 0.0–0.2)

## 2019-10-22 LAB — LIPASE, BLOOD: Lipase: 26 U/L (ref 11–51)

## 2019-10-22 LAB — POCT PREGNANCY, URINE: Preg Test, Ur: NEGATIVE

## 2019-10-22 MED ORDER — SUCRALFATE 1 G PO TABS: 1.0000 g | ORAL_TABLET | Freq: Three times a day (TID) | ORAL | 0 refills | Status: DC

## 2019-10-22 MED ORDER — OMEPRAZOLE 20 MG PO CPDR: 20.0000 mg | DELAYED_RELEASE_CAPSULE | Freq: Every day | ORAL | 1 refills | Status: DC

## 2019-10-22 MED ORDER — PANTOPRAZOLE SODIUM 40 MG PO TBEC: 40.0000 mg | DELAYED_RELEASE_TABLET | Freq: Every day | ORAL | 0 refills | Status: AC

## 2019-10-22 MED ORDER — ONDANSETRON 4 MG PO TBDP: 4.0000 mg | ORAL_TABLET | Freq: Three times a day (TID) | ORAL | 0 refills | Status: DC | PRN

## 2019-10-22 NOTE — Discharge Instructions (Addendum)
Treating you for a possible ulcer.  Take the medication as prescribed.  Do not take any more ibuprofen or Aleve at this time.  You can take Tylenol if you need that for pain. Eat small meals, nothing spicy, greasy and limit caffeine intake. Drink plenty of water If this problem continues you will need to follow-up with your primary care for a referral to GI specialist. If the abdominal pain becomes more severe you need to go to the hospital.

## 2019-10-22 NOTE — ED Triage Notes (Signed)
Pt presents to the UC with abdominal pain, diarrhea, weakness, chills, headache and nausea x 3 days. Pt states she has "anxiety issues" and her medication is not working at all. Pt states she is taking Zofran, with "helps a little bit" with the nausea.

## 2019-10-23 ENCOUNTER — Emergency Department (HOSPITAL_COMMUNITY)
Admission: EM | Admit: 2019-10-23 | Discharge: 2019-10-23 | Disposition: A | Discharge status: Home / Self Care | Payer: 59 | Attending: Emergency Medicine | Admitting: Emergency Medicine

## 2019-10-23 ENCOUNTER — Emergency Department (HOSPITAL_COMMUNITY): Payer: 59

## 2019-10-23 ENCOUNTER — Encounter (HOSPITAL_COMMUNITY): Payer: Self-pay | Admitting: Emergency Medicine

## 2019-10-23 ENCOUNTER — Other Ambulatory Visit: Payer: Self-pay

## 2019-10-23 DIAGNOSIS — Z87891 Personal history of nicotine dependence: Secondary | ICD-10-CM | POA: Diagnosis not present

## 2019-10-23 DIAGNOSIS — R0989 Other specified symptoms and signs involving the circulatory and respiratory systems: Secondary | ICD-10-CM | POA: Diagnosis not present

## 2019-10-23 DIAGNOSIS — R1013 Epigastric pain: Secondary | ICD-10-CM | POA: Diagnosis not present

## 2019-10-23 DIAGNOSIS — R61 Generalized hyperhidrosis: Secondary | ICD-10-CM | POA: Diagnosis not present

## 2019-10-23 DIAGNOSIS — U071 COVID-19: Secondary | ICD-10-CM | POA: Diagnosis not present

## 2019-10-23 DIAGNOSIS — R110 Nausea: Secondary | ICD-10-CM | POA: Insufficient documentation

## 2019-10-23 DIAGNOSIS — R509 Fever, unspecified: Secondary | ICD-10-CM | POA: Diagnosis not present

## 2019-10-23 DIAGNOSIS — Z79899 Other long term (current) drug therapy: Secondary | ICD-10-CM | POA: Diagnosis not present

## 2019-10-23 LAB — CBC WITH DIFFERENTIAL/PLATELET
Abs Immature Granulocytes: 0.02 10*3/uL (ref 0.00–0.07)
Basophils Absolute: 0.1 10*3/uL (ref 0.0–0.1)
Basophils Relative: 1 %
Eosinophils Absolute: 0.1 10*3/uL (ref 0.0–0.5)
Eosinophils Relative: 1 %
HCT: 39.5 % (ref 36.0–46.0)
Hemoglobin: 13.4 g/dL (ref 12.0–15.0)
Immature Granulocytes: 0 %
Lymphocytes Relative: 31 %
Lymphs Abs: 2.6 10*3/uL (ref 0.7–4.0)
MCH: 32.1 pg (ref 26.0–34.0)
MCHC: 33.9 g/dL (ref 30.0–36.0)
MCV: 94.7 fL (ref 80.0–100.0)
Monocytes Absolute: 0.5 10*3/uL (ref 0.1–1.0)
Monocytes Relative: 6 %
Neutro Abs: 5.3 10*3/uL (ref 1.7–7.7)
Neutrophils Relative %: 61 %
Platelets: 288 10*3/uL (ref 150–400)
RBC: 4.17 MIL/uL (ref 3.87–5.11)
RDW: 13.1 % (ref 11.5–15.5)
WBC: 8.5 10*3/uL (ref 4.0–10.5)
nRBC: 0 % (ref 0.0–0.2)

## 2019-10-23 LAB — COMPREHENSIVE METABOLIC PANEL
ALT: 20 U/L (ref 0–44)
AST: 21 U/L (ref 15–41)
Albumin: 3.8 g/dL (ref 3.5–5.0)
Alkaline Phosphatase: 45 U/L (ref 38–126)
Anion gap: 7 (ref 5–15)
BUN: 6 mg/dL (ref 6–20)
CO2: 24 mmol/L (ref 22–32)
Calcium: 9 mg/dL (ref 8.9–10.3)
Chloride: 108 mmol/L (ref 98–111)
Creatinine, Ser: 0.81 mg/dL (ref 0.44–1.00)
GFR calc Af Amer: >60 mL/min (ref >60)
GFR calc non Af Amer: >60 mL/min (ref >60)
Glucose, Bld: 120 mg/dL — ABNORMAL HIGH (ref 70–99)
Potassium: 4.2 mmol/L (ref 3.5–5.1)
Sodium: 139 mmol/L (ref 135–145)
Total Bilirubin: 0.5 mg/dL (ref 0.3–1.2)
Total Protein: 6.4 g/dL — ABNORMAL LOW (ref 6.5–8.1)

## 2019-10-23 LAB — LIPASE, BLOOD: Lipase: 33 U/L (ref 11–51)

## 2019-10-23 MED ORDER — ONDANSETRON 4 MG PO TBDP
4.0000 mg | ORAL_TABLET | Freq: Once | ORAL | Status: AC
  Administered 2019-10-23: 4 mg via ORAL
  Filled 2019-10-23: qty 1

## 2019-10-23 NOTE — ED Provider Notes (Signed)
[facility] Provider Note   CSN: [id number] Arrival date & time: 10/23/19  1357     History   Chief Complaint Chief Complaint  Patient presents with  . Abdominal Pain    HPI Jasmine Hernandez is a 33 y.o. female presenting to the emergency department with complaint of almost 1 week of upper abdominal discomfort associated nausea.  She states her symptoms come and go, and tend to be worse after meals.  Her abdominal discomfort is in the epigastric region with some burning sensation and feeling as she has a lump in her throat sometimes.  She was evaluated at urgent care yesterday for the same complaint and diagnosed with likely PUD.  She was prescribed PPI, Zofran, Carafate.  She states her symptoms have not improved.  She reports today concerned with her subjective fevers which she describes as sweating with alternating chills.  She had Covid infection multiple months ago, however has had multiple negative Covid test since then.  She has been taking ibuprofen twice daily for the last couple of months due to a broken toe.  She states she stopped that yesterday, however took a Goody's powder today for her pain. Patient denies associated change in bowel habits, urinary symptoms, or URI symptoms.     The history is provided by the patient.    Past Medical History:  Diagnosis Date  . CIN III with severe dysplasia   . Depression   . Wears glasses     Patient Active Problem List   Diagnosis Date Noted  . IUD check up 02/15/2017  . CIN III with severe dysplasia 07/06/2016  . Depression 06/04/2011    Past Surgical History:  Procedure Laterality Date  . CERVICAL CONIZATION W/BX N/A 01/26/2017   Procedure: COLD KNIFE CONIZATION CERVIX;  Surgeon: Adolphus BirchwoodEmma Rossi, MD;  [facility];  Service: Gynecology;  Laterality: N/A;  . CESAREAN SECTION  02/13/2009  . COLPOSCOPY N/A 06/28/2016   Procedure: COLPOSCOPY;  Surgeon: Essie HartWalda  Pinn, MD;  Location: [facility];  Service: Gynecology;  Laterality: N/A;  . LEEP N/A 06/28/2016   Procedure: LOOP ELECTROSURGICAL EXCISION PROCEDURE (LEEP);  Surgeon: Essie HartWalda Pinn, MD;  Location: [facility];  Service: Gynecology;  Laterality: N/A;     OB History    Gravida  1   Para  0   Term  0   Preterm  0   AB  0   Living        SAB  0   TAB  0   Ectopic  0   Multiple      Live Births               Home Medications    Prior to Admission medications   Medication Sig Start Date End Date Taking? Authorizing Provider  acetaminophen (TYLENOL) 500 MG tablet Take 500 mg by mouth every 6 (six) hours as needed.    [provider]  busPIRone (BUSPAR) 30 MG tablet Take 1 tablet (30 mg total) by mouth 2 (two) times daily. 09/03/19   Cottle, Steva Readyarey G Jr., MD  doxepin (SINEQUAN) 10 MG capsule TAKE 1-2 CAPSULES AT NIGHT AS NEEDED 07/31/19   Cottle, Steva Readyarey G Jr., MD  ibuprofen (ADVIL) 200 MG tablet Take 200 mg by mouth every 6 (six) hours as needed.    [provider]  levonorgestrel (MIRENA) 20 MCG/24HR IUD by Intrauterine route. Per pt placed June or July 2017    [provider]  ondansetron (ZOFRAN ODT) 4 MG disintegrating tablet Take 1 tablet (4 mg total) by mouth every 8 (eight) hours as needed for nausea or vomiting. 10/22/19   Bast, Gloris Manchester A, NP  ondansetron (ZOFRAN) 4 MG tablet Take 4 mg by mouth every 8 (eight) hours. 10/16/19   [provider]  pantoprazole (PROTONIX) 40 MG tablet Take 1 tablet (40 mg total) by mouth daily. 10/22/19   Bast, Gloris Manchester A, NP  propranolol (INDERAL) 20 MG tablet TAKE 1-2 TABLETS (20-40 MG TOTAL) BY MOUTH 2 (TWO) TIMES DAILY AS NEEDED (ANXIETY). 07/18/19   Cottle, Steva Ready., MD  QUEtiapine (SEROQUEL) 25 MG tablet TAKE 1 TABLET BY MOUTH EVERYDAY AT BEDTIME 10/01/19   Cottle, Steva Ready., MD  sertraline (ZOLOFT) 100 MG tablet TAKE 1 TABLET BY MOUTH EVERY DAY 03/14/19   Cottle, Steva Ready., MD  sertraline (ZOLOFT) 50 MG tablet  TAKE 2&1/2 TABLETS (125 MG TOTAL) BY MOUTH DAILY. 10/01/19   Cottle, Steva Ready., MD  sucralfate (CARAFATE) 1 g tablet Take 1 tablet (1 g total) by mouth 4 (four) times daily -  with meals and at bedtime. 10/22/19   Janace Aris, NP  mirtazapine (REMERON) 15 MG tablet TAKE 1 TABLET BY MOUTH EVERYDAY AT BEDTIME Patient not taking: Reported on 09/03/2019 07/31/19 10/22/19  Lauraine Rinne., MD  omeprazole (PRILOSEC) 20 MG capsule Take 1 capsule (20 mg total) by mouth daily. 10/22/19 10/22/19  Janace Aris, NP    Family History Family History  Problem Relation Age of Onset  . Alcohol abuse Father   . Drug abuse Father   . Cancer Maternal Grandmother   . Cancer Maternal Grandfather   . Depression Mother        maybe drug abuse  . Hypertension Paternal Grandmother   . Anxiety disorder Paternal Grandmother   . Depression Paternal Grandmother   . Drug abuse Paternal Aunt   . Drug abuse Paternal Uncle     Social History Social History   Tobacco Use  . Smoking status: Former Smoker    Packs/day: 0.25    Years: 10.00    Pack years: 2.50    Types: Cigarettes    Quit date: 12/26/2013    Years since quitting: 5.8  . Smokeless tobacco: Never Used  Substance Use Topics  . Alcohol use: No  . Drug use: No    Types: Marijuana    Comment: quit 10/ 2014     Allergies   Patient has no known allergies.   Review of Systems Review of Systems  All other systems reviewed and are negative.    Physical Exam Updated Vital Signs BP 120/71 (BP Location: Left Arm)   Pulse 88   Temp 98.2 F (36.8 C) (Oral)   Resp 18   Ht 5\' 1"  (1.549 m)   Wt 76.2 kg   LMP 10/19/2019 (Exact Date)   SpO2 100%   BMI 31.74 kg/m   Physical Exam Vitals signs and nursing note reviewed.  Constitutional:      General: She is not in acute distress.    Appearance: She is well-developed. She is not ill-appearing.  HENT:     Head: Normocephalic and atraumatic.  Eyes:     Conjunctiva/sclera:  Conjunctivae normal.  Cardiovascular:     Rate and Rhythm: Normal rate and regular rhythm.  Pulmonary:     Effort: Pulmonary effort is normal. No respiratory distress.     Breath sounds: Normal breath sounds.  Abdominal:  General: Abdomen is flat. Bowel sounds are normal.     Palpations: Abdomen is soft.     Tenderness: There is abdominal tenderness in the right upper quadrant and epigastric area. There is no guarding or rebound. Negative signs include Murphy's sign.  Skin:    General: Skin is warm.  Neurological:     Mental Status: She is alert.  Psychiatric:        Behavior: Behavior normal.      ED Treatments / Results  Labs (all labs ordered are listed, but only abnormal results are displayed) Labs Reviewed  COMPREHENSIVE METABOLIC PANEL - Abnormal; Notable for the following components:      Result Value   Glucose, Bld 120 (*)    Total Protein 6.4 (*)    All other components within normal limits  LIPASE, BLOOD  CBC WITH DIFFERENTIAL/PLATELET    EKG None  Radiology US Abdomen Limited Ruq  Result Date: 10/23/2019 CLINICAL DATA:  Epigastric region pain EXAM: ULTRASOUND ABDOMEN LIMITED RIGHT UPPER QUADRANT COMPARISON:  None. FINDINGS: Gallbladder: No gallstones or wall thickening visualized. There is no pericholecystic fluid. No sonographic Murphy sign noted by sonographer. Common bile duct: Diameter: 2 mm. No intrahepatic or extrahepatic biliary duct dilatation. Liver: No focal lesion identified. Within normal limits in parenchymal echogenicity. Portal vein is patent on color Doppler imaging with normal direction of blood flow towards the liver. Other: None. IMPRESSION: Study within normal limits. Electronically Signed   By: Lowella Grip III M.D.   On: 10/23/2019 16:33    Procedures Procedures (including critical care time)  Medications Ordered in ED Medications  ondansetron (ZOFRAN-ODT) disintegrating tablet 4 mg (4 mg Oral Given 10/23/19 1609)      Initial Impression / Assessment and Plan / ED Course  I have reviewed the triage vital signs and the nursing notes.  Pertinent labs & imaging results that were available during my care of the patient were reviewed by me and considered in my medical decision making (see chart for details).        Pt presenting with epigastric abdominal discomfort, likely gastritis vs PUD. Pt has known hx of GERD. Workup today is reassuring, abdominal exam with TTP in upper quadrants, neg murphy, no guarding or rebound. Given description of symptoms, RUQ u/s was done to rule out gallbladder pathology and is negative. Labs are reassuring, no leukocytosis, no electrolyte derangement, normal renal and hepatic function, normal lipase.  Discussed symptomatic management with patient regarding treatment for likely gastritis versus PUD.  Continue PPI and Carafate.  Outpatient referral provided for follow-up.  Strict return precautions discussed.  Patient verbalized understanding and agrees with care plan for discharge.  Discussed results, findings, treatment and follow up. Patient advised of return precautions. Patient verbalized understanding and agreed with plan.   Final Clinical Impressions(s) / ED Diagnoses   Final diagnoses:  Epigastric abdominal pain    ED Discharge Orders    None       Lj Miyamoto, [place] N, PA-C 10/23/19 1715    Isla Pence, MD 10/23/19 1739

## 2019-10-23 NOTE — ED Provider Notes (Signed)
[facility]    CSN: [id number] Arrival date & time: 10/22/19  0932      History   Chief Complaint Chief Complaint  Patient presents with  . Abdominal Pain  . Diarrhea  . Weakness  . Chills  . Headache  . Nausea    HPI Jasmine Hernandez is a 33 y.o. female.   Patient is a 33 year old female the presents today with upper abdominal discomfort, nausea, diarrhea, nausea, chills, weakness x3 days.  Symptoms have been constant, waxing and waning.  She has been able to eat small meals.  Sometimes this makes her feel better but then the abdominal pain returns.  She has been using Pepcid, Protonix, Maalox, Pepto and Zofran with some relief.  Describes the discomfort as nagging, gnawing and burning.  She has had increasing ibuprofen use in the past 2 months due to broken toe.  Denies any blood in stool or hematemesis.  Last bowel movement was yesterday.  Reports this was normal.  Does have past medical history of GERD.  Has not had any fevers.  Gets her temperature checked at work daily.  Also recently tested for Covid which was negative.  Patient very anxious about the situation.  She has been taking her anxiety medicine like she is prescribed.  No dysuria, hematuria or urinary frequency.  No vaginal discharge, itching or irritation.   ROS per HPI      Past Medical History:  Diagnosis Date  . CIN III with severe dysplasia   . Depression   . Wears glasses     Patient Active Problem List   Diagnosis Date Noted  . IUD check up 02/15/2017  . CIN III with severe dysplasia 07/06/2016  . Depression 06/04/2011    Past Surgical History:  Procedure Laterality Date  . CERVICAL CONIZATION W/BX N/A 01/26/2017   Procedure: COLD KNIFE CONIZATION CERVIX;  Surgeon: Everitt Amber, MD;  Location: [facility];  Service: Gynecology;  Laterality: N/A;  . CESAREAN SECTION  02/13/2009  . COLPOSCOPY N/A 06/28/2016   Procedure: COLPOSCOPY;  Surgeon: Sanjuana Kava, MD;   Location: [facility];  Service: Gynecology;  Laterality: N/A;  . LEEP N/A 06/28/2016   Procedure: LOOP ELECTROSURGICAL EXCISION PROCEDURE (LEEP);  Surgeon: Sanjuana Kava, MD;  Location: [facility];  Service: Gynecology;  Laterality: N/A;    OB History    Gravida  1   Para  0   Term  0   Preterm  0   AB  0   Living        SAB  0   TAB  0   Ectopic  0   Multiple      Live Births               Home Medications    Prior to Admission medications   Medication Sig Start Date End Date Taking? Authorizing Provider  acetaminophen (TYLENOL) 500 MG tablet Take 500 mg by mouth every 6 (six) hours as needed.   Yes [provider]  ibuprofen (ADVIL) 200 MG tablet Take 200 mg by mouth every 6 (six) hours as needed.   Yes [provider]  busPIRone (BUSPAR) 30 MG tablet Take 1 tablet (30 mg total) by mouth 2 (two) times daily. 09/03/19   Cottle, Billey Co., MD  doxepin (SINEQUAN) 10 MG capsule TAKE 1-2 CAPSULES AT NIGHT AS NEEDED 07/31/19   Cottle, Billey Co., MD  levonorgestrel [facility]) 20 MCG/24HR IUD by Intrauterine route. Per pt  placed June or July 2017    [provider]  ondansetron (ZOFRAN ODT) 4 MG disintegrating tablet Take 1 tablet (4 mg total) by mouth every 8 (eight) hours as needed for nausea or vomiting. 10/22/19   Banita Lehn, Gloris Manchester A, NP  ondansetron (ZOFRAN) 4 MG tablet Take 4 mg by mouth every 8 (eight) hours. 10/16/19   [provider]  pantoprazole (PROTONIX) 40 MG tablet Take 1 tablet (40 mg total) by mouth daily. 10/22/19   Joelys Staubs, Gloris Manchester A, NP  propranolol (INDERAL) 20 MG tablet TAKE 1-2 TABLETS (20-40 MG TOTAL) BY MOUTH 2 (TWO) TIMES DAILY AS NEEDED (ANXIETY). 07/18/19   Cottle, Steva Ready., MD  QUEtiapine (SEROQUEL) 25 MG tablet TAKE 1 TABLET BY MOUTH EVERYDAY AT BEDTIME 10/01/19   Cottle, Steva Ready., MD  sertraline (ZOLOFT) 100 MG tablet TAKE 1 TABLET BY MOUTH EVERY DAY 03/14/19   Cottle, Steva Ready., MD  sertraline (ZOLOFT) 50 MG tablet TAKE  2&1/2 TABLETS (125 MG TOTAL) BY MOUTH DAILY. 10/01/19   Cottle, Steva Ready., MD  sucralfate (CARAFATE) 1 g tablet Take 1 tablet (1 g total) by mouth 4 (four) times daily -  with meals and at bedtime. 10/22/19   Janace Aris, NP  mirtazapine (REMERON) 15 MG tablet TAKE 1 TABLET BY MOUTH EVERYDAY AT BEDTIME Patient not taking: Reported on 09/03/2019 07/31/19 10/22/19  Lauraine Rinne., MD  omeprazole (PRILOSEC) 20 MG capsule Take 1 capsule (20 mg total) by mouth daily. 10/22/19 10/22/19  Janace Aris, NP    Family History Family History  Problem Relation Age of Onset  . Alcohol abuse Father   . Drug abuse Father   . Cancer Maternal Grandmother   . Cancer Maternal Grandfather   . Depression Mother        maybe drug abuse  . Hypertension Paternal Grandmother   . Anxiety disorder Paternal Grandmother   . Depression Paternal Grandmother   . Drug abuse Paternal Aunt   . Drug abuse Paternal Uncle     Social History Social History   Tobacco Use  . Smoking status: Former Smoker    Packs/day: 0.25    Years: 10.00    Pack years: 2.50    Types: Cigarettes    Quit date: 12/26/2013    Years since quitting: 5.8  . Smokeless tobacco: Never Used  Substance Use Topics  . Alcohol use: No  . Drug use: No    Types: Marijuana    Comment: quit 10/ 2014     Allergies   Patient has no known allergies.   Review of Systems Review of Systems   Physical Exam Triage Vital Signs ED Triage Vitals  Enc Vitals Group     BP 10/22/19 0923 113/72     Pulse Rate 10/22/19 0923 67     Resp 10/22/19 0923 17     Temp 10/22/19 0923 98.4 F (36.9 C)     Temp Source 10/22/19 0923 Oral     SpO2 10/22/19 0923 99 %     Weight --      Height --      Head Circumference --      Peak Flow --      Pain Score 10/22/19 0919 7     Pain Loc --      Pain Edu? --      Excl. in GC? --    No data found.  Updated Vital Signs BP 113/72 (BP Location: Left Arm)  Pulse 67   Temp 98.4 F (36.9 C) (Oral)    Resp 17   LMP 10/19/2019 (Exact Date)   SpO2 99%   Visual Acuity Right Eye Distance:   Left Eye Distance:   Bilateral Distance:    Right Eye Near:   Left Eye Near:    Bilateral Near:     Physical Exam Vitals signs and nursing note reviewed.  Constitutional:      General: She is not in acute distress.    Appearance: She is well-developed. She is not ill-appearing, toxic-appearing or diaphoretic.  HENT:     Head: Normocephalic and atraumatic.     Nose: Nose normal.     Mouth/Throat:     Pharynx: Oropharynx is clear.  Eyes:     Conjunctiva/sclera: Conjunctivae normal.  Neck:     Musculoskeletal: Normal range of motion.  Cardiovascular:     Rate and Rhythm: Normal rate and regular rhythm.  Pulmonary:     Effort: Pulmonary effort is normal.     Breath sounds: Normal breath sounds.  Abdominal:     General: Abdomen is flat. Bowel sounds are normal.     Palpations: Abdomen is soft.     Tenderness: There is abdominal tenderness in the epigastric area and left upper quadrant. There is no right CVA tenderness, left CVA tenderness, guarding or rebound. Negative signs include Murphy's sign.     Hernia: No hernia is present.  Musculoskeletal: Normal range of motion.  Skin:    General: Skin is warm and dry.     Findings: No rash.  Neurological:     Mental Status: She is alert.  Psychiatric:        Mood and Affect: Mood normal.      UC Treatments / Results  Labs (all labs ordered are listed, but only abnormal results are displayed) Labs Reviewed  COMPREHENSIVE METABOLIC PANEL - Abnormal; Notable for the following components:      Result Value   BUN <5 (*)    Calcium 8.8 (*)    Total Protein 6.4 (*)    All other components within normal limits  POCT URINALYSIS DIP (DEVICE) - Abnormal; Notable for the following components:   Hgb urine dipstick TRACE (*)    All other components within normal limits  NOVEL CORONAVIRUS, NAA (HOSP ORDER, SEND-OUT TO REF LAB; TAT 18-24 HRS)   CBC  LIPASE, BLOOD  POCT PREGNANCY, URINE    EKG   Radiology No results found.  Procedures Procedures (including critical care time)  Medications Ordered in UC Medications - No data to display  Initial Impression / Assessment and Plan / UC Course  I have reviewed the triage vital signs and the nursing notes.  Pertinent labs & imaging results that were available during my care of the patient were reviewed by me and considered in my medical decision making (see chart for details).     Epigastric discomfort-most likely patient symptoms related to gastritis.  She could have a small gastric ulcer. Blood work unremarkable here today.  Urine without infection We will go ahead and restart her Protonix and have her use the Carafate as needed.  Recommended to D/C the ibuprofen use Zofran as needed for nausea, vomiting Recommended small bland meals for now and sip water to stay hydrated. Diet recommendations given If this problem continues she will need to see a GI specialist. Patient understanding and agree to plan.  Final Clinical Impressions(s) / UC Diagnoses   Final diagnoses:  Epigastric pain  Discharge Instructions     Treating you for a possible ulcer.  Take the medication as prescribed.  Do not take any more ibuprofen or Aleve at this time.  You can take Tylenol if you need that for pain. Eat small meals, nothing spicy, greasy and limit caffeine intake. Drink plenty of water If this problem continues you will need to follow-up with your primary care for a referral to GI specialist. If the abdominal pain becomes more severe you need to go to the hospital.    ED Prescriptions    Medication Sig Dispense Auth. Provider   ondansetron (ZOFRAN ODT) 4 MG disintegrating tablet Take 1 tablet (4 mg total) by mouth every 8 (eight) hours as needed for nausea or vomiting. 20 tablet Alyha Marines A, NP   sucralfate (CARAFATE) 1 g tablet Take 1 tablet (1 g total) by mouth 4 (four)  times daily -  with meals and at bedtime. 30 tablet Toneka Fullen A, NP   omeprazole (PRILOSEC) 20 MG capsule  (Status: Discontinued) Take 1 capsule (20 mg total) by mouth daily. 30 capsule Beren Yniguez A, NP   pantoprazole (PROTONIX) 40 MG tablet Take 1 tablet (40 mg total) by mouth daily. 30 tablet Dahlia ByesBast, Lofton Leon A, NP     PDMP not reviewed this encounter.   Janace ArisBast, Zoie Sarin A, NP 10/23/19 [phone]

## 2019-10-23 NOTE — ED Triage Notes (Signed)
Pt reports being seen at [facility] yesterday. Endorses chills, fever, nausea, and upper abd pain. States they gave her medicine for peptic ulcer. States she gets tested for covid regularly for work and was negative last week

## 2019-10-23 NOTE — Discharge Instructions (Addendum)
Please read instructions below. Drink clear liquids until your stomach feels better. Then, slowly introduce bland foods into your diet as tolerated.  Avoid spicy, greasy, acidic foods as this can worsen your symptoms.  Avoid NSAID medications such as Advil/ibuprofen/Motrin, Aleve, aspirin, Goody's powder, BC powder.  Remain is sitting upright for at least 45 minutes after meals. Take the medications as prescribed by urgent care yesterday. Follow up with your primary care provider. Return to the ER for severely worsening abdominal pain, uncontrollable vomiting, or vomiting blood.

## 2019-10-24 LAB — NOVEL CORONAVIRUS, NAA (HOSP ORDER, SEND-OUT TO REF LAB; TAT 18-24 HRS): SARS-CoV-2, NAA: NOT DETECTED

## 2019-10-28 ENCOUNTER — Encounter (HOSPITAL_COMMUNITY): Payer: Self-pay | Admitting: Emergency Medicine

## 2019-10-28 ENCOUNTER — Other Ambulatory Visit: Payer: Self-pay

## 2019-10-28 ENCOUNTER — Emergency Department (HOSPITAL_COMMUNITY)
Admission: EM | Admit: 2019-10-28 | Discharge: 2019-10-28 | Discharge status: Against Medical Advice | Payer: 59 | Attending: Emergency Medicine | Admitting: Emergency Medicine

## 2019-10-28 DIAGNOSIS — Z5321 Procedure and treatment not carried out due to patient leaving prior to being seen by health care provider: Secondary | ICD-10-CM | POA: Diagnosis not present

## 2019-10-28 DIAGNOSIS — R1013 Epigastric pain: Secondary | ICD-10-CM | POA: Diagnosis present

## 2019-10-28 LAB — COMPREHENSIVE METABOLIC PANEL
ALT: 38 U/L (ref 0–44)
AST: 36 U/L (ref 15–41)
Albumin: 3.8 g/dL (ref 3.5–5.0)
Alkaline Phosphatase: 41 U/L (ref 38–126)
Anion gap: 9 (ref 5–15)
BUN: 7 mg/dL (ref 6–20)
CO2: 24 mmol/L (ref 22–32)
Calcium: 9 mg/dL (ref 8.9–10.3)
Chloride: 105 mmol/L (ref 98–111)
Creatinine, Ser: 0.72 mg/dL (ref 0.44–1.00)
GFR calc Af Amer: >60 mL/min (ref >60)
GFR calc non Af Amer: >60 mL/min (ref >60)
Glucose, Bld: 111 mg/dL — ABNORMAL HIGH (ref 70–99)
Potassium: 4.1 mmol/L (ref 3.5–5.1)
Sodium: 138 mmol/L (ref 135–145)
Total Bilirubin: 0.8 mg/dL (ref 0.3–1.2)
Total Protein: 6.5 g/dL (ref 6.5–8.1)

## 2019-10-28 LAB — CBC WITH DIFFERENTIAL/PLATELET
Abs Immature Granulocytes: 0.04 10*3/uL (ref 0.00–0.07)
Basophils Absolute: 0 10*3/uL (ref 0.0–0.1)
Basophils Relative: 0 %
Eosinophils Absolute: 0 10*3/uL (ref 0.0–0.5)
Eosinophils Relative: 0 %
HCT: 37.9 % (ref 36.0–46.0)
Hemoglobin: 13 g/dL (ref 12.0–15.0)
Immature Granulocytes: 0 %
Lymphocytes Relative: 19 %
Lymphs Abs: 2.2 10*3/uL (ref 0.7–4.0)
MCH: 31.3 pg (ref 26.0–34.0)
MCHC: 34.3 g/dL (ref 30.0–36.0)
MCV: 91.3 fL (ref 80.0–100.0)
Monocytes Absolute: 0.7 10*3/uL (ref 0.1–1.0)
Monocytes Relative: 6 %
Neutro Abs: 8.6 10*3/uL — ABNORMAL HIGH (ref 1.7–7.7)
Neutrophils Relative %: 75 %
Platelets: 270 10*3/uL (ref 150–400)
RBC: 4.15 MIL/uL (ref 3.87–5.11)
RDW: 13.1 % (ref 11.5–15.5)
WBC: 11.5 10*3/uL — ABNORMAL HIGH (ref 4.0–10.5)
nRBC: 0 % (ref 0.0–0.2)

## 2019-10-28 LAB — URINALYSIS, ROUTINE W REFLEX MICROSCOPIC
Bilirubin Urine: NEGATIVE
Glucose, UA: NEGATIVE mg/dL
Hgb urine dipstick: NEGATIVE
Ketones, ur: NEGATIVE mg/dL
Leukocytes,Ua: NEGATIVE
Nitrite: NEGATIVE
Protein, ur: NEGATIVE mg/dL
Specific Gravity, Urine: 1.014 (ref 1.005–1.030)
pH: 7 (ref 5.0–8.0)

## 2019-10-28 LAB — I-STAT BETA HCG BLOOD, ED (MC, WL, AP ONLY): I-stat hCG, quantitative: <5 m[IU]/mL (ref <5)

## 2019-10-28 LAB — LIPASE, BLOOD: Lipase: 28 U/L (ref 11–51)

## 2019-10-28 NOTE — ED Notes (Signed)
Patient advised registration that she was tired of waiting and was leaving.

## 2019-10-28 NOTE — ED Triage Notes (Signed)
Patient reports epigastric pain with emesis this evening , no fever or diarrhea , patient added brief syncopal episode while vomitting and hit her face against the corner of a table . Alert and oriented at arrival/respirations unlabored, bruise at right lower eye.

## 2019-11-04 ENCOUNTER — Ambulatory Visit: Payer: 59 | Admitting: Psychiatry

## 2019-11-12 ENCOUNTER — Ambulatory Visit: Payer: 59 | Admitting: Physician Assistant

## 2019-11-18 ENCOUNTER — Other Ambulatory Visit: Payer: Self-pay

## 2019-11-18 MED ORDER — BUSPIRONE HCL 30 MG PO TABS: 30.0000 mg | ORAL_TABLET | Freq: Two times a day (BID) | ORAL | 0 refills | Status: DC

## 2019-12-10 ENCOUNTER — Ambulatory Visit (INDEPENDENT_AMBULATORY_CARE_PROVIDER_SITE_OTHER): Payer: 59 | Admitting: Psychiatry

## 2019-12-10 ENCOUNTER — Encounter: Payer: Self-pay | Admitting: Psychiatry

## 2019-12-10 DIAGNOSIS — F4010 Social phobia, unspecified: Secondary | ICD-10-CM | POA: Diagnosis not present

## 2019-12-10 DIAGNOSIS — F5105 Insomnia due to other mental disorder: Secondary | ICD-10-CM | POA: Diagnosis not present

## 2019-12-10 DIAGNOSIS — F4001 Agoraphobia with panic disorder: Secondary | ICD-10-CM

## 2019-12-10 DIAGNOSIS — F411 Generalized anxiety disorder: Secondary | ICD-10-CM

## 2019-12-10 MED ORDER — ONDANSETRON 4 MG PO TBDP: 4.0000 mg | ORAL_TABLET | Freq: Three times a day (TID) | ORAL | 0 refills | Status: AC | PRN

## 2019-12-10 NOTE — Progress Notes (Signed)
Jasmine Hernandez [id number] 1986/04/06 34 y.o.  Virtual Visit via Telephone Note  I connected with pt by telephone and verified that I am speaking with the correct person using two identifiers.   I discussed the limitations, risks, security and privacy concerns of performing an evaluation and management service by telephone and the availability of in person appointments. I also discussed with the patient that there may be a patient responsible charge related to this service. The patient expressed understanding and agreed to proceed.  I discussed the assessment and treatment plan with the patient. The patient was provided an opportunity to ask questions and all were answered. The patient agreed with the plan and demonstrated an understanding of the instructions.   The patient was advised to call back or seek an in-person evaluation if the symptoms worsen or if the condition fails to improve as anticipated.  I provided 15 minutes of non-face-to-face time during this encounter. The call started at 430 and ended at 445. The patient was located at home and the provider was located office.  Subjective:   Patient ID:  Jasmine Hernandez is a 34 y.o. (DOB 1986/08/10) female.  Chief Complaint:  Chief Complaint  Patient presents with  . Follow-up    Medication Management  . Anxiety    Medication Management  . Medication Refill    Zofran    HPI [name] presents to the office today for follow-up of anxiety and initiating sertraline and buspirone.  seen March 08, 2019.  Sertraline had just been increased to 100 mg daily in March and she was seeing improvement.  No meds were changed at that visit.  She called in late July complaining of insomnia and doxepin was called in.  She called again on July 09, 2019 complaining of worsening anxiety including panic attacks.  She stated that the doxepin was not helping her sleep.  She was instructed to increase the sertraline  to 150 mg daily.  She was asking for faster acting medications and appear to be asking for benzodiazepines but because of her history of addiction that was denied.  She was given propranolol 20 to 40 mg twice daily or hydroxyzine 25 mg 3 times daily as needed for anxiety.  Last seen September 03, 2019. Following med changes: Increase sertraline 125 mg daily. Continue buspirone but change to 30 mg BID and be consistent for better anxiety control. This should also improve compliance.  She has not been consistent with the buspirone in part because she did not understand that consistency would help the effectiveness. Quetiapine 25 mg prn severe anxiety.  Anxiety is pretty ok.  No panic since here.  Feel a little residual anxiety.  Bought a house and moved and Covid back at work.  Took Seroquel 25 and hallucinated all night and won't take again.  Sleep irregular.  Doxepin helps 20 mg hangover but manageable and still wakes up through the night. Rare doxepin.  Using melatonin but sleep still pretty crappy.  3 nights/ week sleep fair.  Some nights awakens hourly. Awakens every couple of hours but usually back to sleep easily.  Past couple of mos 2 occassions of little sleep for 2 -3 nights during added stress.  Avg 5-6 hours of sleep.  Normally needs 8 hours of sleep.  No problems with Busipirone 30 BID.  No trial mirtazapine  A spell of depression with stress.  Remained very functional.  Sometimes can dwell on stressors.  Chronic work stress and marital issues  come and go.  Outside family issues also stress her.  Uncertain effect from propranolol.     Caffeine coffee AM and none after 3 pm.  History of addiction and family history of addiction.. In recovery and sober 5 years.  History of drugs and alcohol.  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  DUC opiates and then alcohol.  Went to [facility] treatment for 52 days and sober since.  Detox a couple of times without help.  Past Psychiatric Medication Trials:  Trazodone bad dreams, doxepin 10  Citalopram side effects, buspirone 10 3 times daily sertraline 150 4 weeks couldn't cry at a funeral and reduced to 125.    Quetiapine 25 CO hallucinations  Review of Systems:  Review of Systems  Gastrointestinal: Negative for diarrhea.  Neurological: Negative for tremors and weakness.    Medications: I have reviewed the patient's current medications.  Current Outpatient Medications  Medication Sig Dispense Refill  . acetaminophen (TYLENOL) 500 MG tablet Take 500 mg by mouth every 6 (six) hours as needed.    . busPIRone (BUSPAR) 30 MG tablet Take 1 tablet (30 mg total) by mouth 2 (two) times daily. 180 tablet 0  . doxepin (SINEQUAN) 10 MG capsule TAKE 1-2 CAPSULES AT NIGHT AS NEEDED 180 capsule 0  . ibuprofen (ADVIL) 200 MG tablet Take 200 mg by mouth every 6 (six) hours as needed.    Marland Kitchen levonorgestrel (MIRENA) 20 MCG/24HR IUD by Intrauterine route. Per pt placed June or July 2017    . ondansetron (ZOFRAN ODT) 4 MG disintegrating tablet Take 1 tablet (4 mg total) by mouth every 8 (eight) hours as needed for nausea or vomiting. 20 tablet 0  . pantoprazole (PROTONIX) 40 MG tablet Take 1 tablet (40 mg total) by mouth daily. 30 tablet 0  . propranolol (INDERAL) 20 MG tablet TAKE 1-2 TABLETS (20-40 MG TOTAL) BY MOUTH 2 (TWO) TIMES DAILY AS NEEDED (ANXIETY). 60 tablet 1  . sertraline (ZOLOFT) 50 MG tablet TAKE 2&1/2 TABLETS (125 MG TOTAL) BY MOUTH DAILY. 225 tablet 1   No current facility-administered medications for this visit.    Medication Side Effects: None CO numbness at 150 mg sertraline  Allergies:  Allergies  Allergen Reactions  . Seroquel [Quetiapine] Other (See Comments)    Hallucinations    Past Medical History:  Diagnosis Date  . CIN III with severe dysplasia   . Depression   . Wears glasses     Family History  Problem Relation Age of Onset  . Alcohol abuse Father   . Drug abuse Father   . Cancer Maternal Grandmother   . Cancer  Maternal Grandfather   . Depression Mother        maybe drug abuse  . Hypertension Paternal Grandmother   . Anxiety disorder Paternal Grandmother   . Depression Paternal Grandmother   . Drug abuse Paternal Aunt   . Drug abuse Paternal Uncle     Social History   Socioeconomic History  . Marital status: Married    Spouse name: Not on file  . Number of children: Not on file  . Years of education: Not on file  . Highest education level: Not on file  Occupational History  . Not on file  Tobacco Use  . Smoking status: Former Smoker    Packs/day: 0.25    Years: 10.00    Pack years: 2.50    Types: Cigarettes    Quit date: 12/26/2013    Years since quitting: 5.9  . Smokeless tobacco: Never  Used  Substance and Sexual Activity  . Alcohol use: No  . Drug use: No    Types: Marijuana    Comment: quit 10/ 2014  . Sexual activity: Not on file    Comment: mirena IUD placed June  or July 2017  Other Topics Concern  . Not on file  Social History Narrative   ** Merged History Encounter **       Social Determinants of Health   Financial Resource Strain:   . Difficulty of Paying Living Expenses: Not on file  Food Insecurity:   . Worried About Programme researcher, broadcasting/film/video in the Last Year: Not on file  . Ran Out of Food in the Last Year: Not on file  Transportation Needs:   . Lack of Transportation (Medical): Not on file  . Lack of Transportation (Non-Medical): Not on file  Physical Activity:   . Days of Exercise per Week: Not on file  . Minutes of Exercise per Session: Not on file  Stress:   . Feeling of Stress : Not on file  Social Connections:   . Frequency of Communication with Friends and Family: Not on file  . Frequency of Social Gatherings with Friends and Family: Not on file  . Attends Religious Services: Not on file  . Active Member of Clubs or Organizations: Not on file  . Attends Banker Meetings: Not on file  . Marital Status: Not on file  Intimate Partner  Violence:   . Fear of Current or Ex-Partner: Not on file  . Emotionally Abused: Not on file  . Physically Abused: Not on file  . Sexually Abused: Not on file    Past Medical History, Surgical history, Social history, and Family history were reviewed and updated as appropriate.   Please see review of systems for further details on the patient's review from today.   Objective:   Physical Exam:  There were no vitals taken for this visit.  Physical Exam Neurological:     Mental Status: She is alert and oriented to person, place, and time.     Cranial Nerves: No dysarthria.  Psychiatric:        Attention and Perception: Attention and perception normal.        Mood and Affect: Mood is anxious.        Speech: Speech normal.        Behavior: Behavior is cooperative.        Thought Content: Thought content normal. Thought content is not paranoid or delusional. Thought content does not include homicidal or suicidal ideation. Thought content does not include homicidal or suicidal plan.        Cognition and Memory: Cognition and memory normal.        Judgment: Judgment normal.     Comments: Insight intact     Lab Review:     Component Value Date/Time   NA 138 10/28/2019 0326   K 4.1 10/28/2019 0326   CL 105 10/28/2019 0326   CO2 24 10/28/2019 0326   GLUCOSE 111 (H) 10/28/2019 0326   BUN 7 10/28/2019 0326   CREATININE 0.72 10/28/2019 0326   CALCIUM 9.0 10/28/2019 0326   PROT 6.5 10/28/2019 0326   ALBUMIN 3.8 10/28/2019 0326   AST 36 10/28/2019 0326   ALT 38 10/28/2019 0326   ALKPHOS 41 10/28/2019 0326   BILITOT 0.8 10/28/2019 0326   GFRNONAA >60 10/28/2019 0326   GFRAA >60 10/28/2019 0326       Component Value  Date/Time   WBC 11.5 (H) 10/28/2019 0326   RBC 4.15 10/28/2019 0326   HGB 13.0 10/28/2019 0326   HCT 37.9 10/28/2019 0326   PLT 270 10/28/2019 0326   MCV 91.3 10/28/2019 0326   MCH 31.3 10/28/2019 0326   MCHC 34.3 10/28/2019 0326   RDW 13.1 10/28/2019 0326    LYMPHSABS 2.2 10/28/2019 0326   MONOABS 0.7 10/28/2019 0326   EOSABS 0.0 10/28/2019 0326   BASOSABS 0.0 10/28/2019 0326    No results found for: POCLITH, LITHIUM   No results found for: PHENYTOIN, PHENOBARB, VALPROATE, CBMZ   .res Assessment: Plan:    Panic disorder with agoraphobia  Generalized anxiety disorder  Social anxiety disorder  Insomnia due to mental condition  History of addiction.  PDMP is clean  Panic attacks have largely stopped.  She has residual generalized anxiety but feels that overall it is manageable.  She is clearly seen improvement with the sertraline and buspirone.  Sleep is still a regular problem but she is a little nervous about taking mirtazapine for sleep but she has it in case it is needed.  No med changes today: Continue sertraline 125 mg daily. Continue buspirone  30 mg BID and be consistent for better anxiety control.  I think it helped.  She did not ask for benzodiazepines at this appointment.  DC Quetiapine DT SE  Follow-up 8-12 weeks  Meredith Staggers MD, DFAPA  Please see After Visit Summary for patient specific instructions.  No future appointments.  No orders of the defined types were placed in this encounter.     -------------------------------

## 2020-04-20 ENCOUNTER — Other Ambulatory Visit: Payer: Self-pay

## 2020-04-20 DIAGNOSIS — F4001 Agoraphobia with panic disorder: Secondary | ICD-10-CM

## 2020-04-20 MED ORDER — PROPRANOLOL HCL 20 MG PO TABS: 20.0000 mg | ORAL_TABLET | Freq: Two times a day (BID) | ORAL | 1 refills | Status: DC | PRN

## 2020-04-27 ENCOUNTER — Other Ambulatory Visit: Payer: Self-pay | Admitting: Psychiatry

## 2020-04-27 DIAGNOSIS — F4001 Agoraphobia with panic disorder: Secondary | ICD-10-CM

## 2020-06-27 ENCOUNTER — Other Ambulatory Visit: Payer: Self-pay | Admitting: Psychiatry

## 2020-06-29 NOTE — Telephone Encounter (Signed)
Last apt 01/05 overdue for follow up

## 2020-07-01 NOTE — Telephone Encounter (Signed)
Okay to send? Last visit 12/2019

## 2020-07-25 ENCOUNTER — Other Ambulatory Visit: Payer: Self-pay | Admitting: Psychiatry

## 2020-09-12 ENCOUNTER — Other Ambulatory Visit: Payer: Self-pay | Admitting: Psychiatry

## 2020-09-12 DIAGNOSIS — F4001 Agoraphobia with panic disorder: Secondary | ICD-10-CM

## 2020-09-24 ENCOUNTER — Other Ambulatory Visit: Payer: Self-pay | Admitting: Psychiatry

## 2020-09-28 ENCOUNTER — Other Ambulatory Visit: Payer: Self-pay | Admitting: Psychiatry

## 2020-09-29 NOTE — Telephone Encounter (Signed)
Last apt 12/2019, was due back 6-8 weeks

## 2020-09-29 NOTE — Telephone Encounter (Signed)
Call to RS

## 2020-09-30 NOTE — Telephone Encounter (Signed)
LM for Raea to call the office the schedule a follow up for continued refills.

## 2022-03-25 ENCOUNTER — Emergency Department (HOSPITAL_BASED_OUTPATIENT_CLINIC_OR_DEPARTMENT_OTHER): Payer: Self-pay

## 2022-03-25 ENCOUNTER — Emergency Department (HOSPITAL_BASED_OUTPATIENT_CLINIC_OR_DEPARTMENT_OTHER)
Admission: EM | Admit: 2022-03-25 | Discharge: 2022-03-25 | Disposition: A | Discharge status: Home / Self Care | Payer: Self-pay | Attending: Emergency Medicine | Admitting: Emergency Medicine

## 2022-03-25 ENCOUNTER — Encounter (HOSPITAL_BASED_OUTPATIENT_CLINIC_OR_DEPARTMENT_OTHER): Payer: Self-pay | Admitting: Emergency Medicine

## 2022-03-25 DIAGNOSIS — X509XXA Other and unspecified overexertion or strenuous movements or postures, initial encounter: Secondary | ICD-10-CM | POA: Diagnosis not present

## 2022-03-25 DIAGNOSIS — S8392XA Sprain of unspecified site of left knee, initial encounter: Secondary | ICD-10-CM | POA: Diagnosis not present

## 2022-03-25 DIAGNOSIS — S8992XA Unspecified injury of left lower leg, initial encounter: Secondary | ICD-10-CM | POA: Diagnosis present

## 2022-03-25 MED ORDER — OXYCODONE HCL 5 MG PO TABS: 5.0000 mg | ORAL_TABLET | ORAL | 0 refills | Status: AC | PRN

## 2022-03-25 NOTE — ED Provider Notes (Signed)
?[facility] EMERGENCY DEPARTMENT ?Provider Note ? ? ?CSN: [id number] ?Arrival date & time: 03/25/22  1854 ? ?  ? ?History ? ?Chief Complaint  ?Patient presents with  ? Knee Injury  ? ? ?Jasmine Hernandez is a 36 y.o. female. ? ?HPI ? ?Patient presents due to left knee pain.  Happened about a week ago, she was walking down the steps and her legs twisted with her knee moving towards her body and her ankle moving away from the body.  She is not having pain since then, heard a pop sound.  She has been trying ice, elevation and taking 800 ibuprofen twice daily for the pain.  She is also using a knee immobilizer with minimal relief.  No previous surgeries to the knee, no ankle pain. ? ?Home Medications ?Prior to Admission medications   ?Medication Sig Start Date End Date Taking? Authorizing Provider  ?acetaminophen (TYLENOL) 500 MG tablet Take 500 mg by mouth every 6 (six) hours as needed.    [provider]  ?busPIRone (BUSPAR) 30 MG tablet TAKE 1 TABLET (30 MG TOTAL) BY MOUTH 2 (TWO) TIMES DAILY. 07/28/20   Cottle, Steva Ready., MD  ?doxepin (SINEQUAN) 10 MG capsule TAKE 1-2 CAPSULES AT NIGHT AS NEEDED 07/31/19   Cottle, Steva Ready., MD  ?ibuprofen (ADVIL) 200 MG tablet Take 200 mg by mouth every 6 (six) hours as needed.    [provider]  ?levonorgestrel (MIRENA) 20 MCG/24HR IUD by Intrauterine route. Per pt placed June or July 2017    [provider]  ?ondansetron (ZOFRAN ODT) 4 MG disintegrating tablet Take 1 tablet (4 mg total) by mouth every 8 (eight) hours as needed for nausea or vomiting. 12/10/19   Cottle, Steva Ready., MD  ?pantoprazole (PROTONIX) 40 MG tablet Take 1 tablet (40 mg total) by mouth daily. 10/22/19   Dahlia Byes A, NP  ?propranolol (INDERAL) 20 MG tablet TAKE 1-2 TABLETS (20-40 MG TOTAL) BY MOUTH 2 (TWO) TIMES DAILY AS NEEDED (ANXIETY). 09/14/20   Cottle, Steva Ready., MD  ?sertraline (ZOLOFT) 50 MG tablet TAKE 2&1/2 TABLETS (125 MG TOTAL) BY MOUTH DAILY.  09/29/20   Cottle, Steva Ready., MD  ?mirtazapine (REMERON) 15 MG tablet TAKE 1 TABLET BY MOUTH EVERYDAY AT BEDTIME ?Patient not taking: Reported on 09/03/2019 07/31/19 10/22/19  Lauraine Rinne., MD  ?omeprazole (PRILOSEC) 20 MG capsule Take 1 capsule (20 mg total) by mouth daily. 10/22/19 10/22/19  Janace Aris, NP  ?   ? ?Allergies    ?Seroquel [quetiapine]   ? ?Review of Systems   ?Review of Systems ? ?Physical Exam ?Updated Vital Signs ?BP 122/84 (BP Location: Right Arm)   Pulse 76   Temp 98.3 ?F (36.8 ?C) (Oral)   Resp 16   Ht 5\' 1"  (1.549 m)   Wt 69.4 kg   LMP 03/09/2022   SpO2 99%   BMI 28.91 kg/m?  ?Physical Exam ?Vitals and nursing note reviewed. Exam conducted with a chaperone present.  ?Constitutional:   ?   General: She is not in acute distress. ?   Appearance: Normal appearance.  ?HENT:  ?   Head: Normocephalic and atraumatic.  ?Eyes:  ?   General: No scleral icterus. ?   Extraocular Movements: Extraocular movements intact.  ?   Pupils: Pupils are equal, round, and reactive to light.  ?Cardiovascular:  ?   Pulses: Normal pulses.  ?Musculoskeletal:  ?   Comments: No significant swelling or laxity.  Tolerates  ROM, decreased with internal rotation of knee  ?Skin: ?   Capillary Refill: Capillary refill takes less than 2 seconds.  ?   Coloration: Skin is not jaundiced.  ?Neurological:  ?   Mental Status: She is alert. Mental status is at baseline.  ?   Coordination: Coordination normal.  ? ? ?ED Results / Procedures / Treatments   ?Labs ?(all labs ordered are listed, but only abnormal results are displayed) ?Labs Reviewed - No data to display ? ?EKG ?None ? ?Radiology ?DG Knee Complete 4 Views Left ? ?Result Date: 03/25/2022 ?CLINICAL DATA:  Pain, recent fall EXAM: LEFT KNEE - COMPLETE 4+ VIEW COMPARISON:  None. FINDINGS: No recent fracture or dislocation is seen. There is no significant effusion. Small sclerotic density in the proximal shaft of tibia may suggest benign bone island. IMPRESSION: No  fracture or dislocation is seen in the left knee. Electronically Signed   By: Ernie Avena M.D.   On: 03/25/2022 19:55   ? ?Procedures ?Procedures  ? ? ?Medications Ordered in ED ?Medications - No data to display ? ?ED Course/ Medical Decision Making/ A&P ?  ?                        ?Medical Decision Making ?Amount and/or Complexity of Data Reviewed ?Radiology: ordered. ? ? ?Patient presents with left knee pain.  She is neurovascular intact with good DP and PT, brisk cap refill.  Tolerates ROM, mechanism makes septic joint unlikely.  Gout unlikely for same reason.  Suspect muscle strain, ligamental injury is a possibility but given her ability to tolerate ROM and the fact has been bearing weight for about a week somewhat less likely.  Will discharge with orthopedic follow-up, continued RICE and anti-inflammatory.  Will give short course of narcotics to help with pain control in the short-term.  Return precautions discussed, discharged in stable condition. ? ? ? ? ? ? ? ?Final Clinical Impression(s) / ED Diagnoses ?Final diagnoses:  ?Sprain of left knee, unspecified ligament, initial encounter  ? ? ?Rx / DC Orders ?ED Discharge Orders   ? ? None  ? ?  ? ? ?  ?Theron Arista, PA-C ?03/25/22 2347 ? ?  ?Rozelle Logan, DO ?03/25/22 2355 ? ?

## 2022-03-25 NOTE — ED Triage Notes (Signed)
Patient reports tripping on steps Sunday and twisting left knee. Has been wearing a knee brace and taking ibuprofen. Patient has been walking and placing weight on knee all week. Painful with certain movements.  ?

## 2022-03-25 NOTE — Discharge Instructions (Addendum)
Continue RICE, follow-up with orthopedics information provided above.  Continue wearing the knee immobilizer.  ?

## 2022-03-25 NOTE — ED Notes (Signed)
Pt. Reports a fall on Last Sunday and no relief of the L knee pain since the fall.  Pt. Able to walk on the L leg with pain.  Pt. Has edema in the L knee with brown and yellow bruising noted to the L leg in various places from the L knee to the L foot.  Pt. Reports she fell down "some stairs".  Pedal pulse noted in the L foot.Marland Kitchen ?

## 2022-09-20 ENCOUNTER — Ambulatory Visit: Payer: Self-pay | Admitting: Cardiology

## 2023-09-19 IMAGING — DX DG KNEE COMPLETE 4+V*L*
4 series · 4 of 4 positions shown · non-contrast
Comparison: None.

CLINICAL DATA: Pain, recent fall

EXAM:
LEFT KNEE - COMPLETE 4+ VIEW

[knee ap]
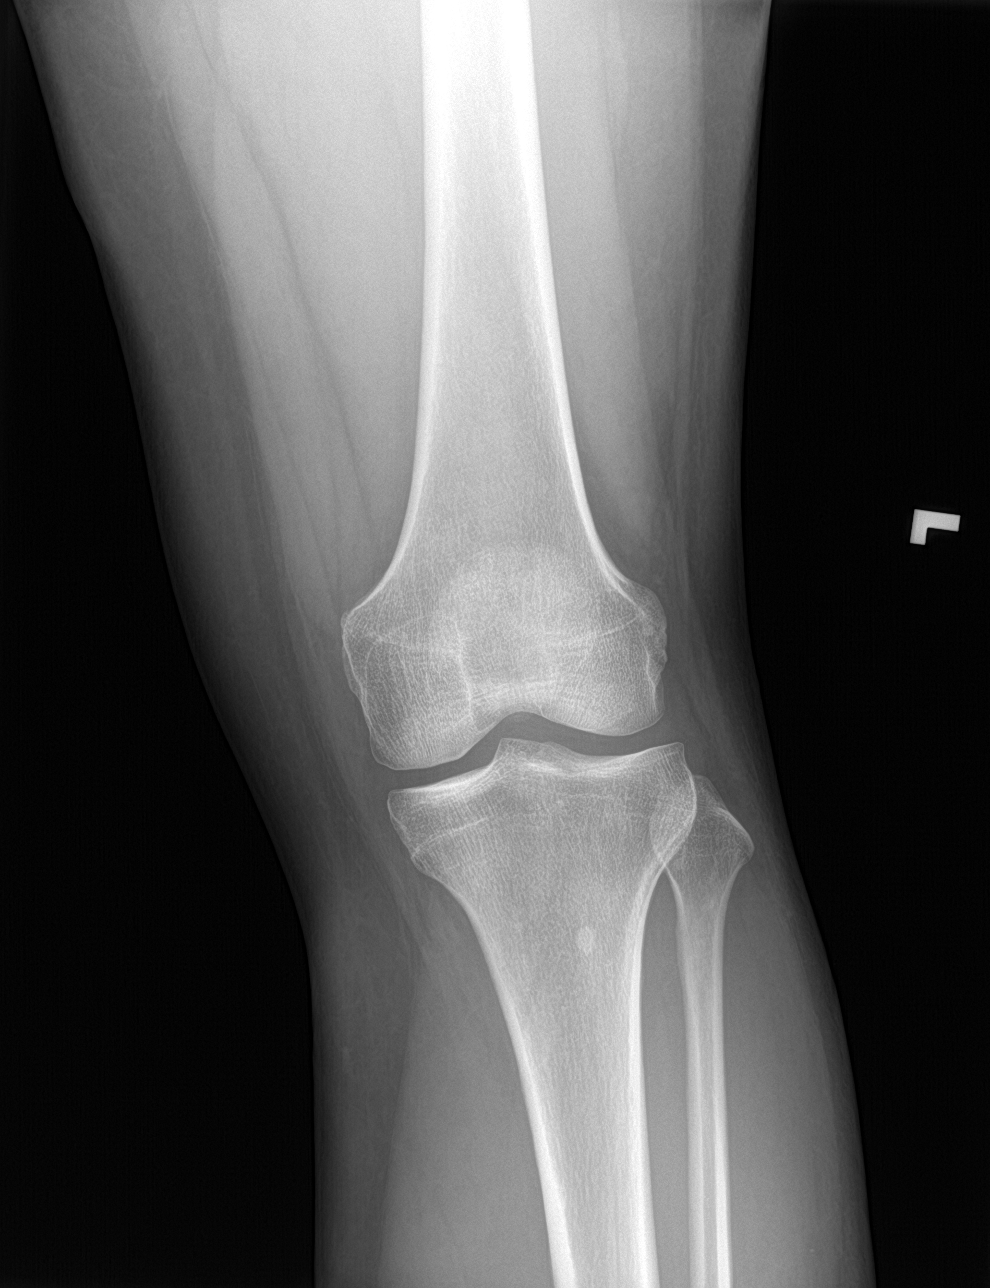

[knee lat]
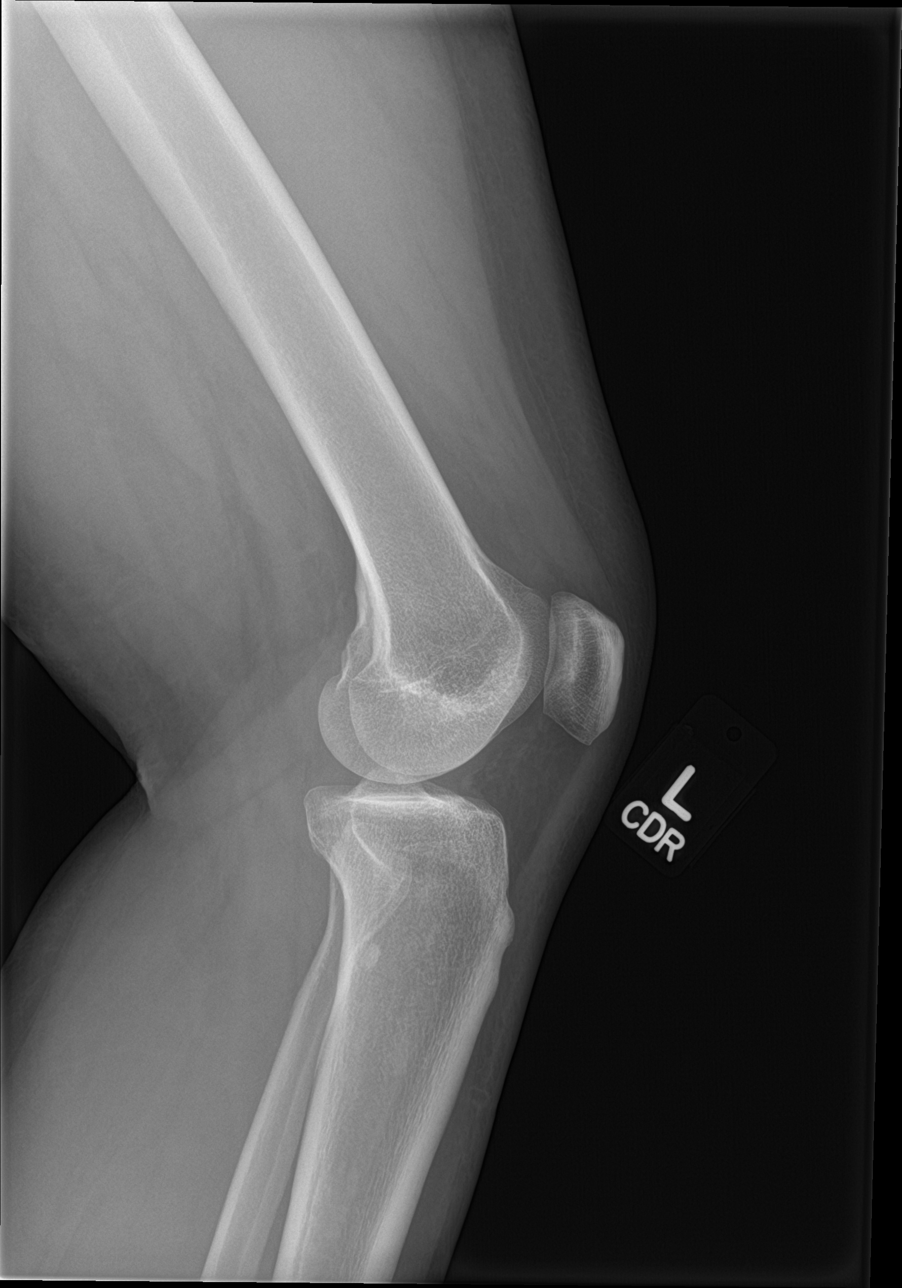

[knee obl (1 of 2)]
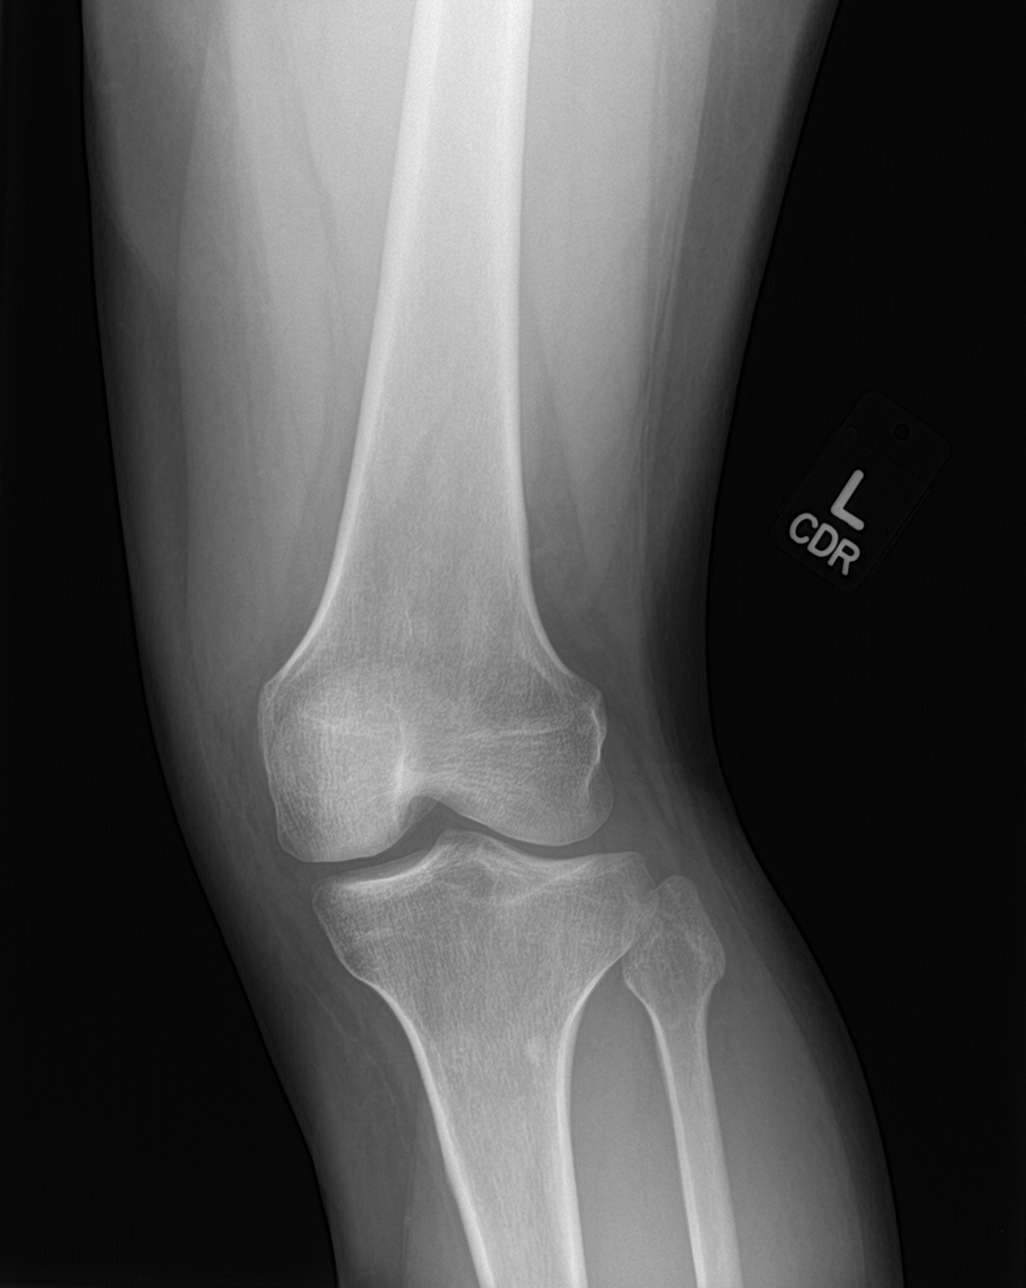

[knee obl (2 of 2)]
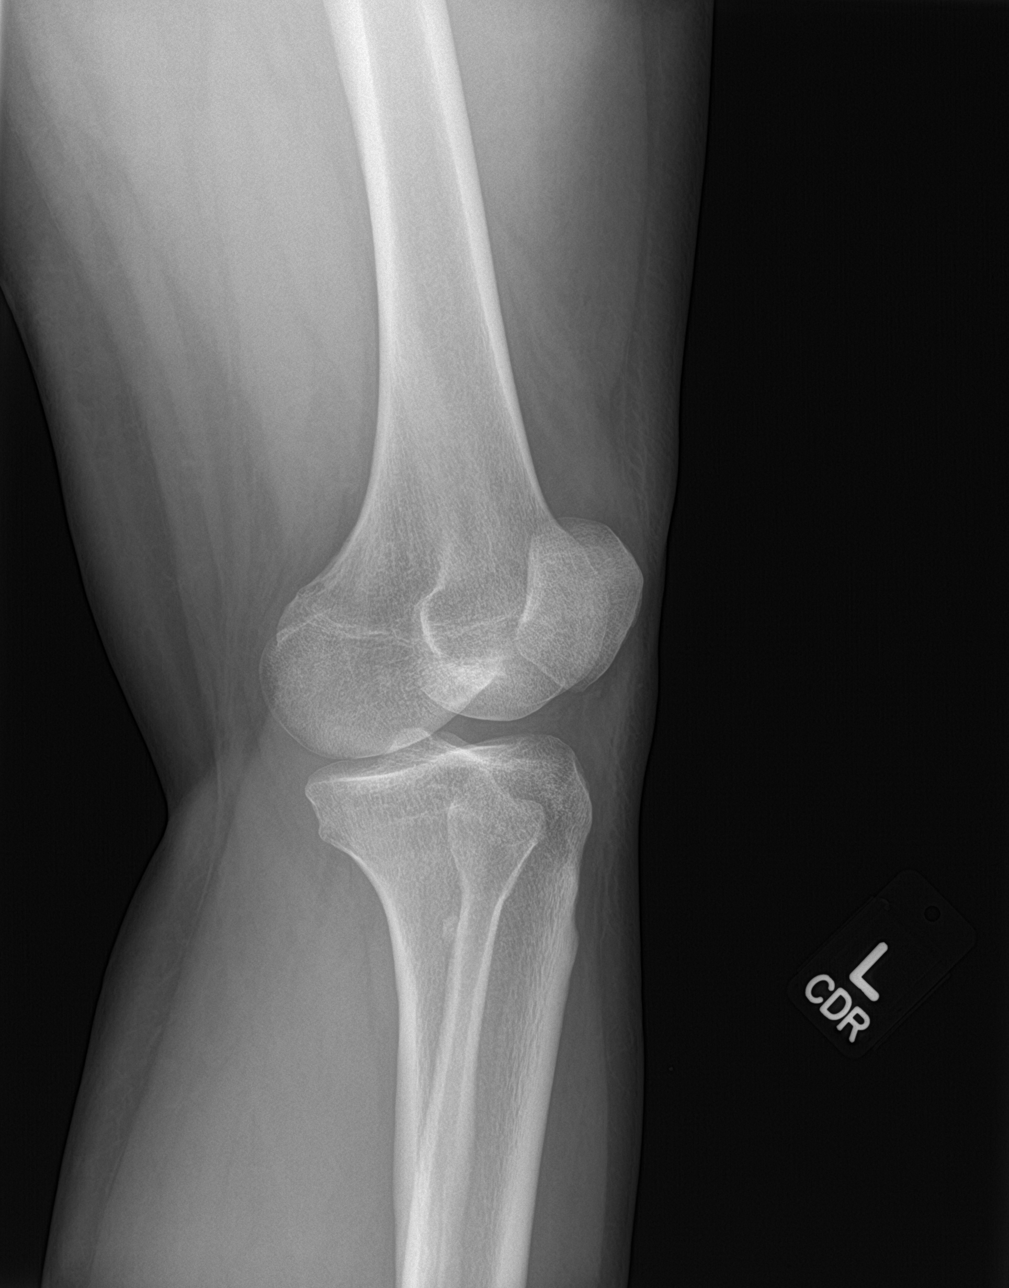

[4 of 4 positions shown; findings below may reference images not displayed]

FINDINGS: No recent fracture or dislocation is seen. There is no significant
effusion. Small sclerotic density in the proximal shaft of tibia may
suggest benign bone island.
IMPRESSION: No fracture or dislocation is seen in the left knee.

## 2023-11-24 ENCOUNTER — Other Ambulatory Visit: Payer: Self-pay

## 2023-11-24 ENCOUNTER — Encounter (HOSPITAL_BASED_OUTPATIENT_CLINIC_OR_DEPARTMENT_OTHER): Payer: Self-pay

## 2023-11-24 ENCOUNTER — Emergency Department (HOSPITAL_BASED_OUTPATIENT_CLINIC_OR_DEPARTMENT_OTHER): Payer: Managed Care, Other (non HMO)

## 2023-11-24 ENCOUNTER — Emergency Department (HOSPITAL_BASED_OUTPATIENT_CLINIC_OR_DEPARTMENT_OTHER)
Admission: EM | Admit: 2023-11-24 | Discharge: 2023-11-24 | Disposition: A | Discharge status: Home / Self Care | Payer: Managed Care, Other (non HMO) | Attending: Emergency Medicine | Admitting: Emergency Medicine

## 2023-11-24 DIAGNOSIS — M25521 Pain in right elbow: Secondary | ICD-10-CM | POA: Diagnosis not present

## 2023-11-24 DIAGNOSIS — M545 Low back pain, unspecified: Secondary | ICD-10-CM | POA: Insufficient documentation

## 2023-11-24 DIAGNOSIS — M546 Pain in thoracic spine: Secondary | ICD-10-CM | POA: Diagnosis not present

## 2023-11-24 DIAGNOSIS — S0990XA Unspecified injury of head, initial encounter: Secondary | ICD-10-CM | POA: Insufficient documentation

## 2023-11-24 LAB — PREGNANCY, URINE: Preg Test, Ur: NEGATIVE

## 2023-11-24 MED ORDER — ACETAMINOPHEN 500 MG PO TABS
1000.0000 mg | ORAL_TABLET | Freq: Once | ORAL | Status: AC
Start: 2023-11-24 — End: 2023-11-24
  Administered 2023-11-24: 1000 mg via ORAL
  Filled 2023-11-24: qty 2
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
